# Patient Record
Sex: Male | Born: 1955
Health system: Southern US, Community
[De-identification: ages and names within clinical notes are randomized; demographics above are authoritative.]

## PROBLEM LIST (undated history)

## (undated) DIAGNOSIS — I82409 Acute embolism and thrombosis of unspecified deep veins of unspecified lower extremity: Secondary | ICD-10-CM

## (undated) DIAGNOSIS — M549 Dorsalgia, unspecified: Secondary | ICD-10-CM

## (undated) DIAGNOSIS — M25551 Pain in right hip: Secondary | ICD-10-CM

## (undated) DIAGNOSIS — I10 Essential (primary) hypertension: Secondary | ICD-10-CM

## (undated) DIAGNOSIS — M2450 Contracture, unspecified joint: Secondary | ICD-10-CM

## (undated) DIAGNOSIS — M25529 Pain in unspecified elbow: Secondary | ICD-10-CM

## (undated) DIAGNOSIS — G8929 Other chronic pain: Secondary | ICD-10-CM

## (undated) DIAGNOSIS — M720 Palmar fascial fibromatosis [Dupuytren]: Secondary | ICD-10-CM

## (undated) DIAGNOSIS — E785 Hyperlipidemia, unspecified: Secondary | ICD-10-CM

## (undated) HISTORY — DX: Acute embolism and thrombosis of unspecified deep veins of unspecified lower extremity: I82.409

## (undated) HISTORY — DX: Dorsalgia, unspecified: M54.9

## (undated) HISTORY — DX: Hyperlipidemia, unspecified: E78.5

## (undated) HISTORY — DX: Pain in right hip: M25.551

## (undated) HISTORY — PX: NO PAST SURGERIES: SHX2092

## (undated) HISTORY — DX: Other chronic pain: G89.29

## (undated) HISTORY — DX: Palmar fascial fibromatosis (dupuytren): M72.0

## (undated) HISTORY — DX: Contracture, unspecified joint: M24.50

## (undated) HISTORY — DX: Pain in unspecified elbow: M25.529

---

## 1999-06-26 ENCOUNTER — Encounter: Admission: RE | Admit: 1999-06-26 | Discharge: 1999-06-26 | Payer: Self-pay | Admitting: Family Medicine

## 1999-06-26 ENCOUNTER — Encounter: Payer: Self-pay | Admitting: Family Medicine

## 2001-02-25 ENCOUNTER — Encounter: Admission: RE | Admit: 2001-02-25 | Discharge: 2001-05-26 | Payer: Self-pay | Admitting: Orthopedic Surgery

## 2003-03-29 ENCOUNTER — Encounter: Admission: RE | Admit: 2003-03-29 | Discharge: 2003-03-29 | Payer: Self-pay | Admitting: Family Medicine

## 2005-11-12 LAB — HM COLONOSCOPY

## 2007-01-16 DIAGNOSIS — M2450 Contracture, unspecified joint: Secondary | ICD-10-CM

## 2007-01-16 HISTORY — DX: Contracture, unspecified joint: M24.50

## 2016-08-14 ENCOUNTER — Ambulatory Visit (HOSPITAL_COMMUNITY)
Admission: EM | Admit: 2016-08-14 | Discharge: 2016-08-14 | Disposition: A | Discharge status: Home / Self Care | Payer: 59 | Attending: Emergency Medicine | Admitting: Emergency Medicine

## 2016-08-14 ENCOUNTER — Encounter (HOSPITAL_COMMUNITY): Payer: Self-pay | Admitting: Emergency Medicine

## 2016-08-14 DIAGNOSIS — M79662 Pain in left lower leg: Secondary | ICD-10-CM

## 2016-08-14 DIAGNOSIS — M7989 Other specified soft tissue disorders: Secondary | ICD-10-CM

## 2016-08-14 HISTORY — DX: Essential (primary) hypertension: I10

## 2016-08-14 NOTE — Discharge Instructions (Signed)
Pain and swelling could be due to venous/arterial insufficiency, past knee injury. Take ibuprofen as directed for the next 10 days. Ice compress, elevation and compression to help with swelling. Monitor for worsening of symptoms such as increased pain/swelling, increased redness/warmth to go th the emergency department for further evaluation. If experiencing chest pain, shortness of breath, weakness, dizziness or other concerning symptoms, to go to the emergency department immediately.

## 2016-08-14 NOTE — ED Triage Notes (Signed)
PT reports swelling and pain over left calf that has worsened over past few days. PT is hypertensive. PT has not seen a doctor in 8-10 years. No history of blood clot. PT reports "two knots" on left foot a few months ago.

## 2016-08-14 NOTE — ED Provider Notes (Signed)
CSN: [id number]     Arrival date & time 08/14/16  1321 History   None    Chief Complaint  Patient presents with  . Leg Swelling   (Consider location/radiation/quality/duration/timing/severity/associated sxs/prior Treatment) 61 year old male with history of hypertension comes in for on and off swelling and pain of the left calf. Patient states he has not seen a doctor in 8-10 years. Symptoms worse after standing for the whole day, and is relieved by elevation. Denies numbness, tingling, injury. Denies surrounding erythema, increased warmth. Has not taken anything for it. Patient is every day smoker. Denies chest pain, shortness of breath, wheezing, trouble breathing. Denies weakness, dizziness, syncope, headache.      Past Medical History:  Diagnosis Date  . Hypertension    History reviewed. No pertinent surgical history. No family history on file. Social History  Substance Use Topics  . Smoking status: Current Every Day Smoker    Packs/day: 1.50    Types: Cigarettes  . Smokeless tobacco: Never Used  . Alcohol use Yes    Review of Systems  Reason unable to perform ROS: See HPI as above.    Allergies  Patient has no known allergies.  Home Medications   Prior to Admission medications   Not on File   Meds Ordered and Administered this Visit  Medications - No data to display  BP (!) 190/128   Pulse (!) 106   Temp 99.5 F (37.5 C) (Oral)   Resp 16   Ht 6' (1.829 m)   Wt 215 lb (97.5 kg)   SpO2 96%   BMI 29.16 kg/m  No data found.   Physical Exam  Constitutional: He is oriented to person, place, and time. He appears well-developed and well-nourished. No distress.  HENT:  Head: Normocephalic and atraumatic.  Cardiovascular: Normal rate, regular rhythm and normal heart sounds.  Exam reveals no gallop and no friction rub.   No murmur heard. Pulmonary/Chest: Effort normal and breath sounds normal. He has no wheezes. He has no rales.  Musculoskeletal:  Mild  swelling of the left medial calf. No surrounding erythema, increased warmth. No palpable cords. No pain on palpation of the knees bilaterally. Mild tenderness on palpation of the left medial calf. Strength normal and equal bilaterally. Sensation intact and equal bilaterally. Pedal pulses 2+ and equal. No pitting edema.  Neurological: He is alert and oriented to person, place, and time.  Skin: Skin is warm and dry.  Psychiatric: He has a normal mood and affect. His behavior is normal. Judgment normal.    Urgent Care Course     Procedures (including critical care time)  Labs Review Labs Reviewed - No data to display  Imaging Review No results found.    MDM   1. Pain and swelling of left lower leg    1. Discussed with patient possible causes of swelling and pain of the left calf, including DVT, past injury, venous and/or arterial insufficiency. I am reassured that patient's welling waxes and wanes with rest, but discussed with patient that he should have an ultrasound done to evaluate for DVT, venous/arterial insufficieny in the near future. Given patient has not seen a doctor in 8-10 years with hypertension, hesitant to prescribe for stronger NSAIDs to help with pain. Patient to continue with ibuprofen as directed for pain. Ice compress, elevation and compression to help with swelling. Patient to follow up with PCP as soon as possible for further evaluation.  2. Discussed vitals with patient today, patient denies chest pain, shortness  of breath, headache, weakness, dizziness, syncope. Patient to follow up with PCP as soon as possible for hypertension management. Patient instructed to go to the ED immediately if experiencing above symptoms. Patient encouraged to reduce smoking, alcohol/caffiene use, salt intake to help with hypertension management. Resources for PCP given to patient.    Belinda FisherYu, Amy V, PA-C 08/14/16 1415

## 2016-08-16 ENCOUNTER — Encounter: Payer: Self-pay | Admitting: Internal Medicine

## 2016-08-16 ENCOUNTER — Ambulatory Visit (INDEPENDENT_AMBULATORY_CARE_PROVIDER_SITE_OTHER): Payer: 59 | Admitting: Internal Medicine

## 2016-08-16 VITALS — BP 178/102 | HR 103 | Temp 98.6°F | Wt 213.8 lb

## 2016-08-16 DIAGNOSIS — M79605 Pain in left leg: Secondary | ICD-10-CM | POA: Diagnosis not present

## 2016-08-16 DIAGNOSIS — E7800 Pure hypercholesterolemia, unspecified: Secondary | ICD-10-CM

## 2016-08-16 DIAGNOSIS — I10 Essential (primary) hypertension: Secondary | ICD-10-CM | POA: Diagnosis not present

## 2016-08-16 DIAGNOSIS — M7989 Other specified soft tissue disorders: Secondary | ICD-10-CM

## 2016-08-16 LAB — COMPREHENSIVE METABOLIC PANEL WITH GFR
ALT: 25 U/L (ref 0–53)
AST: 22 U/L (ref 0–37)
Albumin: 4 g/dL (ref 3.5–5.2)
Alkaline Phosphatase: 75 U/L (ref 39–117)
BUN: 12 mg/dL (ref 6–23)
CO2: 31 meq/L (ref 19–32)
Calcium: 9.6 mg/dL (ref 8.4–10.5)
Chloride: 102 meq/L (ref 96–112)
Creatinine, Ser: 0.98 mg/dL (ref 0.40–1.50)
GFR: 82.5 mL/min
Glucose, Bld: 100 mg/dL — ABNORMAL HIGH (ref 70–99)
Potassium: 4.9 meq/L (ref 3.5–5.1)
Sodium: 137 meq/L (ref 135–145)
Total Bilirubin: 0.6 mg/dL (ref 0.2–1.2)
Total Protein: 7.5 g/dL (ref 6.0–8.3)

## 2016-08-16 LAB — HEMOGLOBIN A1C: Hgb A1c MFr Bld: 5.6 % (ref 4.6–6.5)

## 2016-08-16 LAB — CBC
HCT: 50.9 % (ref 39.0–52.0)
Hemoglobin: 16.8 g/dL (ref 13.0–17.0)
MCHC: 32.9 g/dL (ref 30.0–36.0)
MCV: 100.6 fl — ABNORMAL HIGH (ref 78.0–100.0)
Platelets: 236 10*3/uL (ref 150.0–400.0)
RBC: 5.06 Mil/uL (ref 4.22–5.81)
RDW: 12.9 % (ref 11.5–15.5)
WBC: 8.8 10*3/uL (ref 4.0–10.5)

## 2016-08-16 LAB — LIPID PANEL
Cholesterol: 180 mg/dL (ref 0–200)
HDL: 41.6 mg/dL (ref >39.00)
LDL Cholesterol: 119 mg/dL — ABNORMAL HIGH (ref 0–99)
NonHDL: 138.84
Total CHOL/HDL Ratio: 4
Triglycerides: 98 mg/dL (ref 0.0–149.0)
VLDL: 19.6 mg/dL (ref 0.0–40.0)

## 2016-08-16 MED ORDER — LISINOPRIL-HYDROCHLOROTHIAZIDE 10-12.5 MG PO TABS
1.0000 | ORAL_TABLET | Freq: Every day | ORAL | 0 refills | Status: DC
Start: 2016-08-16 — End: 2016-08-31

## 2016-08-16 NOTE — Assessment & Plan Note (Signed)
CMET, Lipid profile and A1C today May need to restart Simvastatin Advised him to consume a low fat diet

## 2016-08-16 NOTE — Progress Notes (Signed)
HPI  Pt presents to the clinic today to establish care and for management of chronic conditions. He has not had a PCP in many years.  HTN: He was in the ER with c/o right leg pain 2 days ago. His BP at that time was 190/128. He was not started on antihypertensive medication at that time but was advised to follow up with his PCP to discuss treatment options. His BP today is 178/102. He denies headaches, visual changes, chest tightness or shortness of breath. He has been on Lisinopril in the past.  HLD: He does not know the last time his cholesterol was checked. He is not taking any cholesterol lowering medications at this time. He was on Simvastatin in the past. He does not consume a low fat diet  He also c/o left calf pain. This started 1 week ago. He has noticed swelling and discomfort but denies redness or warmth. He was seen in the ER for the same. No intervention was done. He has taken Ibuprofen and elevation with some relief.  Flu: never Tetanus: never PSA Screening: never Colon Screening: ? 2007 Vision Screening: as needed Dentist: as needed  Past Medical History:  Diagnosis Date  . Hyperlipidemia   . Hypertension     Current Outpatient Prescriptions  Medication Sig Dispense Refill  . ASPIRIN 81 PO Take 1 tablet by mouth daily.    Marland Kitchen. ibuprofen (ADVIL,MOTRIN) 200 MG tablet Take 200 mg by mouth every 6 (six) hours as needed.     No current facility-administered medications for this visit.     No Known Allergies  Family History  Problem Relation Age of Onset  . Heart disease Mother     Social History   Social History  . Marital status: Divorced    Spouse name: N/A  . Number of children: N/A  . Years of education: N/A   Occupational History  . Not on file.   Social History Main Topics  . Smoking status: Current Every Day Smoker    Packs/day: 1.50    Types: Cigarettes  . Smokeless tobacco: Never Used  . Alcohol use Yes     Comment: daily  . Drug use: No  .  Sexual activity: Not on file   Other Topics Concern  . Not on file   Social History Narrative  . No narrative on file    ROS:  Constitutional: Denies fever, malaise, fatigue, headache or abrupt weight changes.  HEENT: Denies eye pain, eye redness, ear pain, ringing in the ears, wax buildup, runny nose, nasal congestion, bloody nose, or sore throat. Respiratory: Denies difficulty breathing, shortness of breath, cough or sputum production.   Cardiovascular: Denies chest pain, chest tightness, palpitations or swelling in the hands or feet.  Gastrointestinal: Denies abdominal pain, bloating, constipation, diarrhea or blood in the stool.  GU: Denies frequency, urgency, pain with urination, blood in urine, odor or discharge. Musculoskeletal: Pt reports left leg swelling and pain. Denies decrease in range of motion, difficulty with gait.  Skin: Denies redness, rashes, lesions or ulcercations.  Neurological: Denies dizziness, difficulty with memory, difficulty with speech or problems with balance and coordination.  Psych: Denies anxiety, depression, SI/HI.  No other specific complaints in a complete review of systems (except as listed in HPI above).  PE:  BP (!) 178/102   Pulse (!) 103   Temp 98.6 F (37 C) (Oral)   Wt 213 lb 12 oz (97 kg)   SpO2 97%   BMI 28.99 kg/m  Wt Readings from Last 3 Encounters:  08/16/16 213 lb 12 oz (97 kg)  08/14/16 215 lb (97.5 kg)    General: Appears his stated age, well developed, well nourished in NAD. Skin: Dry and intact. Cardiovascular: Tachycardic with normal rhythm. S1,S2 noted.  No murmur, rubs or gallops noted. No JVD or BLE edema. No carotid bruits noted. Pulmonary/Chest: Normal effort and positive vesicular breath sounds. No respiratory distress. No wheezes, rales or ronchi noted.  Musculoskeletal: Left calf bigger than right calf. No redness or warmth noted. No difficulty with gait. Neurological: Alert and oriented.  Psychiatric: Mood  and affect normal. Behavior is normal. Judgment and thought content normal.   Assessment and Plan:  Left Leg Pain and Swelling:  Will obtain ultrasound of LLE  RTC in 2 weeks for BP check BAITY, REGINA, NP

## 2016-08-16 NOTE — Patient Instructions (Signed)

## 2016-08-16 NOTE — Assessment & Plan Note (Signed)
Will start Lisinopril HCT CMET today

## 2016-08-17 ENCOUNTER — Telehealth: Payer: Self-pay | Admitting: Family Medicine

## 2016-08-17 ENCOUNTER — Ambulatory Visit (HOSPITAL_COMMUNITY)
Admission: RE | Admit: 2016-08-17 | Discharge: 2016-08-17 | Disposition: A | Discharge status: Home / Self Care | Payer: 59 | Source: Ambulatory Visit | Attending: Cardiovascular Disease | Admitting: Cardiovascular Disease

## 2016-08-17 DIAGNOSIS — I82432 Acute embolism and thrombosis of left popliteal vein: Secondary | ICD-10-CM | POA: Insufficient documentation

## 2016-08-17 DIAGNOSIS — Z720 Tobacco use: Secondary | ICD-10-CM | POA: Diagnosis not present

## 2016-08-17 DIAGNOSIS — M7989 Other specified soft tissue disorders: Secondary | ICD-10-CM | POA: Diagnosis not present

## 2016-08-17 DIAGNOSIS — M79605 Pain in left leg: Secondary | ICD-10-CM | POA: Diagnosis not present

## 2016-08-17 DIAGNOSIS — I82402 Acute embolism and thrombosis of unspecified deep veins of left lower extremity: Secondary | ICD-10-CM | POA: Diagnosis not present

## 2016-08-17 DIAGNOSIS — I82890 Acute embolism and thrombosis of other specified veins: Secondary | ICD-10-CM | POA: Insufficient documentation

## 2016-08-17 DIAGNOSIS — I82442 Acute embolism and thrombosis of left tibial vein: Secondary | ICD-10-CM | POA: Diagnosis not present

## 2016-08-17 DIAGNOSIS — I82412 Acute embolism and thrombosis of left femoral vein: Secondary | ICD-10-CM | POA: Insufficient documentation

## 2016-08-17 DIAGNOSIS — I10 Essential (primary) hypertension: Secondary | ICD-10-CM | POA: Insufficient documentation

## 2016-08-17 DIAGNOSIS — I82409 Acute embolism and thrombosis of unspecified deep veins of unspecified lower extremity: Secondary | ICD-10-CM

## 2016-08-17 DIAGNOSIS — E785 Hyperlipidemia, unspecified: Secondary | ICD-10-CM | POA: Diagnosis not present

## 2016-08-17 HISTORY — DX: Acute embolism and thrombosis of unspecified deep veins of unspecified lower extremity: I82.409

## 2016-08-17 MED ORDER — RIVAROXABAN 15 MG PO TABS: 15.0000 mg | ORAL_TABLET | Freq: Two times a day (BID) | ORAL | 0 refills | Status: DC

## 2016-08-17 NOTE — Telephone Encounter (Signed)
Walter DavenportSandra with [facility] called report; pt positive for DVT thru out lt leg. Pamala Hurry Baity NP out of office and Dr Reece AgarG was notified.Pt notified as instructed;pt will cb if xarelto is too expensive; [facility] minimizing ibuprofen use while on Xarelto. Pt will start Xarelto today and if CP or SOB pt will go to ED.pt has 30' appt scheduled on 08/20/16 at 11:15 with Pamala Hurry Baity NP. Lorain ChildesFYI to Pamala Hurry Baity NP and Dr Reece AgarG.

## 2016-08-17 NOTE — Telephone Encounter (Signed)
Received call for positive DVT on US this morning. rec start xarelto sent to pharmacy.  15mg  bid for 3 wks then 20mg  daily for next several months, f/u with PCP next week to further discuss newly found DVT, duration, etc.  If dyspnea, chest pain, worsening sxs, to ER over weekend.  Try to minimize ibuprofen use while on xarelto.

## 2016-08-18 NOTE — Telephone Encounter (Signed)
Noted, thank you for addressing this in my absence

## 2016-08-20 ENCOUNTER — Ambulatory Visit (INDEPENDENT_AMBULATORY_CARE_PROVIDER_SITE_OTHER): Payer: 59 | Admitting: Internal Medicine

## 2016-08-20 ENCOUNTER — Encounter: Payer: Self-pay | Admitting: Internal Medicine

## 2016-08-20 VITALS — BP 138/80 | HR 101 | Temp 98.9°F | Wt 211.5 lb

## 2016-08-20 DIAGNOSIS — I82402 Acute embolism and thrombosis of unspecified deep veins of left lower extremity: Secondary | ICD-10-CM | POA: Insufficient documentation

## 2016-08-20 NOTE — Progress Notes (Signed)
Subjective:    Patient ID: Walter Maddox, male    DOB: 1955-04-02, 61 y.o.   MRN: [id number]  HPI  Pt presents to the clinic to follow up of left leg DVT. This was confirmed on LE Ultrasound 8/3. He was started on Xarelto. He reports the swelling has decreased, but he continue to have discomfort, especially with walking. He is not sedentary, he has not experienced a long car travel or air travel. He has no history of blood clots in his family. He does smoke.  Review of Systems      Past Medical History:  Diagnosis Date  . Hyperlipidemia   . Hypertension     Current Outpatient Prescriptions  Medication Sig Dispense Refill  . ASPIRIN 81 PO Take 1 tablet by mouth daily.    Marland Kitchen ibuprofen (ADVIL,MOTRIN) 200 MG tablet Take 200 mg by mouth every 6 (six) hours as needed.    Marland Kitchen lisinopril-hydrochlorothiazide (PRINZIDE,ZESTORETIC) 10-12.5 MG tablet Take 1 tablet by mouth daily. 30 tablet 0  . Rivaroxaban (XARELTO) 15 MG TABS tablet Take 1 tablet (15 mg total) by mouth 2 (two) times daily with a meal. For 3 weeks then change to 20mg  dose once daily. 42 tablet 0   No current facility-administered medications for this visit.     No Known Allergies  Family History  Problem Relation Age of Onset  . Heart disease Mother     Social History   Social History  . Marital status: Divorced    Spouse name: N/A  . Number of children: N/A  . Years of education: N/A   Occupational History  . Not on file.   Social History Main Topics  . Smoking status: Current Every Day Smoker    Packs/day: 1.50    Years: 34.00    Types: Cigarettes  . Smokeless tobacco: Never Used  . Alcohol use 3.6 oz/week    6 Cans of beer per week     Comment: daily  . Drug use: No  . Sexual activity: Yes   Other Topics Concern  . Not on file   Social History Narrative  . No narrative on file     Constitutional: Denies fever, malaise, fatigue, headache or abrupt weight changes.  Musculoskeletal: Pt reports  left lower extremity pain. Denies decrease in range of motion, difficulty with gait, muscle pain or joint swelling.  Skin: Denies redness, rashes, lesions or ulcercations.    No other specific complaints in a complete review of systems (except as listed in HPI above).  Objective:   Physical Exam  BP 138/80   Pulse (!) 101   Temp 98.9 F (37.2 C) (Oral)   Wt 211 lb 8 oz (95.9 kg)   SpO2 98%   BMI 28.68 kg/m  Wt Readings from Last 3 Encounters:  08/20/16 211 lb 8 oz (95.9 kg)  08/16/16 213 lb 12 oz (97 kg)  08/14/16 215 lb (97.5 kg)    General: Appears his stated age, well developed, well nourished in NAD. Skin: Warm, dry and intact. No redness or warmth noted.  Musculoskeletal: No swelling. Calf tender to palpation. No difficulty with gait.  BMET    Component Value Date/Time   NA 137 08/16/2016 1448   K 4.9 08/16/2016 1448   CL 102 08/16/2016 1448   CO2 31 08/16/2016 1448   GLUCOSE 100 (H) 08/16/2016 1448   BUN 12 08/16/2016 1448   CREATININE 0.98 08/16/2016 1448   CALCIUM 9.6 08/16/2016 1448    Lipid  Panel     Component Value Date/Time   CHOL 180 08/16/2016 1448   TRIG 98.0 08/16/2016 1448   HDL 41.60 08/16/2016 1448   CHOLHDL 4 08/16/2016 1448   VLDL 19.6 08/16/2016 1448   LDLCALC 119 (H) 08/16/2016 1448    CBC    Component Value Date/Time   WBC 8.8 08/16/2016 1448   RBC 5.06 08/16/2016 1448   HGB 16.8 08/16/2016 1448   HCT 50.9 08/16/2016 1448   PLT 236.0 08/16/2016 1448   MCV 100.6 (H) 08/16/2016 1448   MCHC 32.9 08/16/2016 1448   RDW 12.9 08/16/2016 1448    Hgb A1C Lab Results  Component Value Date   HGBA1C 5.6 08/16/2016           Assessment & Plan:

## 2016-08-20 NOTE — Patient Instructions (Signed)
Deep Vein Thrombosis A deep vein thrombosis (DVT) is a blood clot (thrombus) that usually occurs in a deep, larger vein of the lower leg or the pelvis, or in an upper extremity such as the arm. These are dangerous and can lead to serious and even life-threatening complications if the clot travels to the lungs. A DVT can damage the valves in your leg veins so that instead of flowing upward, the blood pools in the lower leg. This is called post-thrombotic syndrome, and it can result in pain, swelling, discoloration, and sores on the leg. What are the causes? A DVT is caused by the formation of a blood clot in your leg, pelvis, or arm. Usually, several things contribute to the formation of blood clots. A clot may develop when:  Your blood flow slows down.  Your vein becomes damaged in some way.  You have a condition that makes your blood clot more easily.  What increases the risk? A DVT is more likely to develop in:  People who are older, especially over 60 years of age.  People who are overweight (obese).  People who sit or lie still for a long time, such as during long-distance travel (over 4 hours), bed rest, hospitalization, or during recovery from certain medical conditions like a stroke.  People who do not engage in much physical activity (sedentary lifestyle).  People who have chronic breathing disorders.  People who have a personal or family history of blood clots or blood clotting disease.  People who have peripheral vascular disease (PVD), diabetes, or some types of cancer.  People who have heart disease, especially if the person had a recent heart attack or has congestive heart failure.  People who have neurological diseases that affect the legs (leg paresis).  People who have had a traumatic injury, such as breaking a hip or leg.  People who have recently had major or lengthy surgery, especially on the hip, knee, or abdomen.  People who have had a central line placed  inside a large vein.  People who take medicines that contain the hormone estrogen. These include birth control pills and hormone replacement therapy.  Pregnancy or during childbirth or the postpartum period.  Long plane flights (over 8 hours).  What are the signs or symptoms?  Symptoms of a DVT can include:  Swelling of your leg or arm, especially if one side is much worse.  Warmth and redness of your leg or arm, especially if one side is much worse.  Pain in your arm or leg. If the clot is in your leg, symptoms may be more noticeable or worse when you stand or walk.  A feeling of pins and needles, if the clot is in the arm.  The symptoms of a DVT that has traveled to the lungs (pulmonary embolism, PE) usually start suddenly and include:  Shortness of breath while active or at rest.  Coughing or coughing up blood or blood-tinged mucus.  Chest pain that is often worse with deep breaths.  Rapid or irregular heartbeat.  Feeling light-headed or dizzy.  Fainting.  Feeling anxious.  Sweating.  There may also be pain and swelling in a leg if that is where the blood clot started. These symptoms may represent a serious problem that is an emergency. Do not wait to see if the symptoms will go away. Get medical help right away. Call your local emergency services (911 in the [place] Do not drive yourself to the hospital. How is this diagnosed? Your health   care provider will take a medical history and perform a physical exam. You may also have other tests, including:  Blood tests to assess the clotting properties of your blood.  Imaging tests, such as CT, ultrasound, MRI, X-ray, and other tests to see if you have clots anywhere in your body.  How is this treated? After a DVT is identified, it can be treated. The type of treatment that you receive depends on many factors, such as the cause of your DVT, your risk for bleeding or developing more clots, and other medical conditions that  you have. Sometimes, a combination of treatments is necessary. Treatment options may be combined and include:  Monitoring the blood clot with ultrasound.  Taking medicines by mouth, such as newer blood thinners (anticoagulants), thrombolytics, or warfarin.  Taking anticoagulant medicine by injection or through an IV tube.  Wearing compression stockings or using different types ofdevices.  Surgery (rare) to remove the blood clot or to place a filter in your abdomen to stop the blood clot from traveling to your lungs.  Treatments for a DVT are often divided into immediate treatment and long-term treatment (up to 3 months after DVT). You can work with your health care provider to choose the treatment program that is best for you. Follow these instructions at home: If you are taking a newer oral anticoagulant:  Take the medicine every single day at the same time each day.  Understand what foods and drugs interact with this medicine.  Understand that there are no regular blood tests required when using this medicine.  Understand the side effects of this medicine, including excessive bruising or bleeding. Ask your health care provider or pharmacist about other possible side effects. If you are taking warfarin:  Understand how to take warfarin and know which foods can affect how warfarin works in your body.  Understand that it is dangerous to take too much or too little warfarin. Too much warfarin increases the risk of bleeding. Too little warfarin continues to allow the risk for blood clots.  Follow your PT and INR blood testing schedule. The PT and INR results allow your health care provider to adjust your dose of warfarin. It is very important that you have your PT and INR tested as often as told by your health care provider.  Avoid major changes in your diet, or tell your health care provider before you change your diet. Arrange a visit with a registered dietitian to answer your  questions. Many foods, especially foods that are high in vitamin K, can interfere with warfarin and affect the PT and INR results. Eat a consistent amount of foods that are high in vitamin K, such as: ? Spinach, kale, broccoli, cabbage, collard greens, turnip greens, Brussels sprouts, peas, cauliflower, seaweed, and parsley. ? Beef liver and pork liver. ? Green tea. ? Soybean oil.  Tell your health care provider about any and all medicines, vitamins, and supplements that you take, including aspirin and other over-the-counter anti-inflammatory medicines. Be especially cautious with aspirin and anti-inflammatory medicines. Do not take those before you ask your health care provider if it is safe to do so. This is important because many medicines can interfere with warfarin and affect the PT and INR results.  Do not start or stop taking any over-the-counter or prescription medicine unless your health care provider or pharmacist tells you to do so. If you take warfarin, you will also need to do these things:  Hold pressure over cuts for longer than   usual.  Tell your dentist and other health care providers that you are taking warfarin before you have any procedures in which bleeding may occur.  Avoid alcohol or drink very small amounts. Tell your health care provider if you change your alcohol intake.  Do not use tobacco products, including cigarettes, chewing tobacco, and e-cigarettes. If you need help quitting, ask your health care provider.  Avoid contact sports.  General instructions  Take over-the-counter and prescription medicines only as told by your health care provider. Anticoagulant medicines can have side effects, including easy bruising and difficulty stopping bleeding. If you are prescribed an anticoagulant, you will also need to do these things: ? Hold pressure over cuts for longer than usual. ? Tell your dentist and other health care providers that you are taking anticoagulants  before you have any procedures in which bleeding may occur. ? Avoid contact sports.  Wear a medical alert bracelet or carry a medical alert card that says you have had a PE.  Ask your health care provider how soon you can go back to your normal activities. Stay active to prevent new blood clots from forming.  Make sure to exercise while traveling or when you have been sitting or standing for a long period of time. It is very important to exercise. Exercise your legs by walking or by tightening and relaxing your leg muscles often. Take frequent walks.  Wear compression stockings as told by your health care provider to help prevent more blood clots from forming.  Do not use tobacco products, including cigarettes, chewing tobacco, and e-cigarettes. If you need help quitting, ask your health care provider.  Keep all follow-up appointments with your health care provider. This is important. How is this prevented? Take these actions to decrease your risk of developing another DVT:  Exercise regularly. For at least 30 minutes every day, engage in: ? Activity that involves moving your arms and legs. ? Activity that encourages good blood flow through your body by increasing your heart rate.  Exercise your arms and legs every hour during long-distance travel (over 4 hours). Drink plenty of water and avoid drinking alcohol while traveling.  Avoid sitting or lying in bed for long periods of time without moving your legs.  Maintain a weight that is appropriate for your height. Ask your health care provider what weight is healthy for you.  If you are a woman who is over 35 years of age, avoid unnecessary use of medicines that contain estrogen. These include birth control pills.  Do not smoke, especially if you take estrogen medicines. If you need help quitting, ask your health care provider.  If you are hospitalized, prevention measures may include:  Early walking after surgery, as soon as your  health care provider says that it is safe.  Receiving anticoagulants to prevent blood clots.If you cannot take anticoagulants, other options may be available, such as wearing compression stockings or using different types of devices.  Get help right away if:  You have new or increased pain, swelling, or redness in an arm or leg.  You have numbness or tingling in an arm or leg.  You have shortness of breath while active or at rest.  You have chest pain.  You have a rapid or irregular heartbeat.  You feel light-headed or dizzy.  You cough up blood.  You notice blood in your vomit, bowel movement, or urine. These symptoms may represent a serious problem that is an emergency. Do not wait to see   if the symptoms will go away. Get medical help right away. Call your local emergency services (911 in the [place] Do not drive yourself to the hospital. This information is not intended to replace advice given to you by your health care provider. Make sure you discuss any questions you have with your health care provider. Document Released: 01/01/2005 Document Revised: 06/09/2015 Document Reviewed: 04/28/2014 Elsevier Interactive Patient Education  2017 [employer]  

## 2016-08-20 NOTE — Assessment & Plan Note (Signed)
Continue Xarelto Referral to hematology placed

## 2016-08-27 ENCOUNTER — Telehealth: Payer: Self-pay | Admitting: Internal Medicine

## 2016-08-27 NOTE — Telephone Encounter (Signed)
Paperwork faxed °Pt aware °Copy for file  °Copy for scan °Copy for pt °

## 2016-08-27 NOTE — Telephone Encounter (Signed)
Pt dropped of short term disability claim form  In [facility] IN BOX

## 2016-08-27 NOTE — Telephone Encounter (Signed)
Done, given to Robin 

## 2016-08-29 ENCOUNTER — Encounter: Payer: Self-pay | Admitting: Internal Medicine

## 2016-08-31 ENCOUNTER — Ambulatory Visit (INDEPENDENT_AMBULATORY_CARE_PROVIDER_SITE_OTHER): Payer: 59 | Admitting: Internal Medicine

## 2016-08-31 ENCOUNTER — Encounter: Payer: Self-pay | Admitting: Internal Medicine

## 2016-08-31 VITALS — BP 120/70 | HR 97 | Temp 98.8°F | Wt 208.5 lb

## 2016-08-31 DIAGNOSIS — I10 Essential (primary) hypertension: Secondary | ICD-10-CM | POA: Diagnosis not present

## 2016-08-31 DIAGNOSIS — I82402 Acute embolism and thrombosis of unspecified deep veins of left lower extremity: Secondary | ICD-10-CM

## 2016-08-31 MED ORDER — RIVAROXABAN 20 MG PO TABS: 20.0000 mg | ORAL_TABLET | Freq: Every day | ORAL | 5 refills | Status: DC

## 2016-08-31 MED ORDER — LISINOPRIL-HYDROCHLOROTHIAZIDE 10-12.5 MG PO TABS: 1.0000 | ORAL_TABLET | Freq: Every day | ORAL | 5 refills | Status: DC

## 2016-08-31 NOTE — Assessment & Plan Note (Addendum)
RX for Xarelto 20 mg daily sent to pharmacy Monitor for s/s of bleeding No NSAID's while on Xarelto

## 2016-08-31 NOTE — Assessment & Plan Note (Addendum)
Now at goal ECG today with left atrial enlargement BMET today Lisinopril HCT refilled today  RTC in 6 months for your annual exam

## 2016-08-31 NOTE — Progress Notes (Signed)
Subjective:    Patient ID: Walter Maddox, male    DOB: 23-May-1955, 61 y.o.   MRN: [id number]  HPI  Pt presents to the clinic today for follow up of HTN. At his last visit, he was started on Lisinopril HCT. He has been taking the medication as prescribed. He denies adverse side effects. His BP today is 120/70. There is no ECG on file.  Of note, he needs a RX for the Xarelto 20 mg daily. He is about to finish up his 3 weeks of 15 mg BID. He has not noticed any s/s of bleeding.   Review of Systems      Past Medical History:  Diagnosis Date  . Hyperlipidemia   . Hypertension     Current Outpatient Prescriptions  Medication Sig Dispense Refill  . ASPIRIN 81 PO Take 1 tablet by mouth daily.    Marland Kitchen ibuprofen (ADVIL,MOTRIN) 200 MG tablet Take 200 mg by mouth every 6 (six) hours as needed.    Marland Kitchen lisinopril-hydrochlorothiazide (PRINZIDE,ZESTORETIC) 10-12.5 MG tablet Take 1 tablet by mouth daily. 30 tablet 0  . Rivaroxaban (XARELTO) 15 MG TABS tablet Take 1 tablet (15 mg total) by mouth 2 (two) times daily with a meal. For 3 weeks then change to 20mg  dose once daily. 42 tablet 0   No current facility-administered medications for this visit.     No Known Allergies  Family History  Problem Relation Age of Onset  . Heart disease Mother     Social History   Social History  . Marital status: Divorced    Spouse name: N/A  . Number of children: N/A  . Years of education: N/A   Occupational History  . Not on file.   Social History Main Topics  . Smoking status: Current Every Day Smoker    Packs/day: 1.50    Years: 34.00    Types: Cigarettes  . Smokeless tobacco: Never Used  . Alcohol use 3.6 oz/week    6 Cans of beer per week     Comment: daily  . Drug use: No  . Sexual activity: Yes   Other Topics Concern  . Not on file   Social History Narrative  . No narrative on file     Constitutional: Denies fever, malaise, fatigue, headache or abrupt weight changes.    Respiratory: Denies difficulty breathing, shortness of breath, cough or sputum production.   Cardiovascular: Denies chest pain, chest tightness, palpitations or swelling in the hands or feet.   No other specific complaints in a complete review of systems (except as listed in HPI above).  Objective:   Physical Exam   BP 120/70   Pulse 97   Temp 98.8 F (37.1 C) (Oral)   Wt 208 lb 8 oz (94.6 kg)   SpO2 97%   BMI 28.28 kg/m  Wt Readings from Last 3 Encounters:  08/31/16 208 lb 8 oz (94.6 kg)  08/20/16 211 lb 8 oz (95.9 kg)  08/16/16 213 lb 12 oz (97 kg)    General: Appears his stated age, well developed, well nourished in NAD. Cardiovascular: Normal rate and rhythm. Split S1 noted.  No murmur, rubs or gallops noted. No JVD or BLE edema.  Pulmonary/Chest: Normal effort and positive vesicular breath sounds. No respiratory distress. No wheezes, rales or ronchi noted.  Musculoskeletal: Normal range of motion. No signs of joint swelling. No difficulty with gait.  Neurological: Alert and oriented.    BMET    Component Value Date/Time  NA 137 08/16/2016 1448   K 4.9 08/16/2016 1448   CL 102 08/16/2016 1448   CO2 31 08/16/2016 1448   GLUCOSE 100 (H) 08/16/2016 1448   BUN 12 08/16/2016 1448   CREATININE 0.98 08/16/2016 1448   CALCIUM 9.6 08/16/2016 1448    Lipid Panel     Component Value Date/Time   CHOL 180 08/16/2016 1448   TRIG 98.0 08/16/2016 1448   HDL 41.60 08/16/2016 1448   CHOLHDL 4 08/16/2016 1448   VLDL 19.6 08/16/2016 1448   LDLCALC 119 (H) 08/16/2016 1448    CBC    Component Value Date/Time   WBC 8.8 08/16/2016 1448   RBC 5.06 08/16/2016 1448   HGB 16.8 08/16/2016 1448   HCT 50.9 08/16/2016 1448   PLT 236.0 08/16/2016 1448   MCV 100.6 (H) 08/16/2016 1448   MCHC 32.9 08/16/2016 1448   RDW 12.9 08/16/2016 1448    Hgb A1C Lab Results  Component Value Date   HGBA1C 5.6 08/16/2016           Assessment & Plan:

## 2016-08-31 NOTE — Patient Instructions (Signed)

## 2016-09-01 LAB — BASIC METABOLIC PANEL
BUN: 12 mg/dL (ref 7–25)
CALCIUM: 9.5 mg/dL (ref 8.6–10.3)
CO2: 22 mmol/L (ref 20–32)
CREATININE: 1 mg/dL (ref 0.70–1.25)
Chloride: 100 mmol/L (ref 98–110)
GLUCOSE: 103 mg/dL — AB (ref 65–99)
Potassium: 3.9 mmol/L (ref 3.5–5.3)
SODIUM: 138 mmol/L (ref 135–146)

## 2016-09-03 ENCOUNTER — Encounter: Payer: Self-pay | Admitting: Oncology

## 2016-09-03 ENCOUNTER — Inpatient Hospital Stay: Payer: 59 | Attending: Oncology | Admitting: Oncology

## 2016-09-03 ENCOUNTER — Inpatient Hospital Stay: Payer: 59

## 2016-09-03 VITALS — BP 130/89 | HR 100 | Temp 96.3°F | Resp 18 | Ht 72.84 in | Wt 209.6 lb

## 2016-09-03 DIAGNOSIS — Z716 Tobacco abuse counseling: Secondary | ICD-10-CM

## 2016-09-03 DIAGNOSIS — I82402 Acute embolism and thrombosis of unspecified deep veins of left lower extremity: Secondary | ICD-10-CM

## 2016-09-03 DIAGNOSIS — Z7982 Long term (current) use of aspirin: Secondary | ICD-10-CM | POA: Diagnosis not present

## 2016-09-03 DIAGNOSIS — Z7901 Long term (current) use of anticoagulants: Secondary | ICD-10-CM | POA: Insufficient documentation

## 2016-09-03 DIAGNOSIS — D7589 Other specified diseases of blood and blood-forming organs: Secondary | ICD-10-CM

## 2016-09-03 DIAGNOSIS — Z129 Encounter for screening for malignant neoplasm, site unspecified: Secondary | ICD-10-CM

## 2016-09-03 DIAGNOSIS — I10 Essential (primary) hypertension: Secondary | ICD-10-CM | POA: Diagnosis not present

## 2016-09-03 DIAGNOSIS — F17210 Nicotine dependence, cigarettes, uncomplicated: Secondary | ICD-10-CM | POA: Diagnosis not present

## 2016-09-03 DIAGNOSIS — E785 Hyperlipidemia, unspecified: Secondary | ICD-10-CM | POA: Diagnosis not present

## 2016-09-03 LAB — CBC WITH DIFFERENTIAL/PLATELET
BASOS ABS: 0.1 10*3/uL (ref 0–0.1)
Basophils Relative: 1 %
Eosinophils Absolute: 0.1 10*3/uL (ref 0–0.7)
Eosinophils Relative: 1 %
HEMATOCRIT: 48.2 % (ref 40.0–52.0)
HEMOGLOBIN: 17.2 g/dL (ref 13.0–18.0)
Lymphocytes Relative: 21 %
Lymphs Abs: 2.2 10*3/uL (ref 1.0–3.6)
MCH: 33.2 pg (ref 26.0–34.0)
MCHC: 35.6 g/dL (ref 32.0–36.0)
MCV: 93.2 fL (ref 80.0–100.0)
Monocytes Absolute: 1 10*3/uL (ref 0.2–1.0)
Monocytes Relative: 10 %
NEUTROS PCT: 67 %
Neutro Abs: 7.2 10*3/uL — ABNORMAL HIGH (ref 1.4–6.5)
Platelets: 322 10*3/uL (ref 150–440)
RBC: 5.17 MIL/uL (ref 4.40–5.90)
RDW: 12.4 % (ref 11.5–14.5)
WBC: 10.6 10*3/uL (ref 3.8–10.6)

## 2016-09-03 LAB — TECHNOLOGIST SMEAR REVIEW

## 2016-09-03 LAB — FOLATE: FOLATE: 27 ng/mL (ref >5.9)

## 2016-09-03 LAB — VITAMIN B12: VITAMIN B 12: 750 pg/mL (ref 180–914)

## 2016-09-03 NOTE — Progress Notes (Signed)
Hematology/Oncology Consult note [facility] [phone] [phone]  CONSULT NOTE Patient Care Team: Lorre Munroe, NP as PCP - General (Internal Medicine)  CHIEF COMPLAINTS/PURPOSE OF CONSULTATION:  I have leg clots.   HISTORY OF PRESENTING ILLNESS:  Walter Maddox 61 y.o.  male with past medical history listed as below was referred by his primary care provider Baity [facility] for evaluation and management of extremity DVT. Patient presented to emergency room on 08/16/2016 with complaints of lower extremity pain right side. Patient reports that his lower extremity pain started about a week ago prior to him presenting to the emergency room. Ultrasound duplex of bilateral lower extremity revealed acute DVT in the left distal common femoral, femoral popliteal, gastrocnemius, posterior tibial, and peroneal veins. No DVT on the right side. Patient was started on Xarelto for anticoagulation.   ROS:  Review of Systems  Constitutional: Negative.   HENT:  Negative.   Eyes: Negative.   Respiratory: Negative.   Cardiovascular: Negative.   Gastrointestinal: Negative.   Endocrine: Negative.   Genitourinary: Negative.    Musculoskeletal: Negative.   Skin: Negative.   Neurological: Negative.   Hematological: Negative.   Psychiatric/Behavioral: Negative.     MEDICAL HISTORY:  Past Medical History:  Diagnosis Date  . Hyperlipidemia   . Hypertension     SURGICAL HISTORY: No past surgical history on file.  SOCIAL HISTORY: Social History   Social History  . Marital status: Divorced    Spouse name: N/A  . Number of children: N/A  . Years of education: N/A   Occupational History  . Not on file.   Social History Main Topics  . Smoking status: Current Every Day Smoker    Packs/day: 1.50    Years: 34.00    Types: Cigarettes  . Smokeless tobacco: Never Used  . Alcohol use 3.6 oz/week    6 Cans of beer per week     Comment: daily  .  Drug use: No  . Sexual activity: Yes   Other Topics Concern  . Not on file   Social History Narrative  . No narrative on file    FAMILY HISTORY: Family History  Problem Relation Age of Onset  . Heart disease Mother     ALLERGIES:  has No Known Allergies.  MEDICATIONS:  Current Outpatient Prescriptions  Medication Sig Dispense Refill  . ASPIRIN 81 PO Take 1 tablet by mouth daily.    Marland Kitchen ibuprofen (ADVIL,MOTRIN) 200 MG tablet Take 200 mg by mouth every 6 (six) hours as needed.    Marland Kitchen lisinopril-hydrochlorothiazide (PRINZIDE,ZESTORETIC) 10-12.5 MG tablet Take 1 tablet by mouth daily. 30 tablet 5  . rivaroxaban (XARELTO) 20 MG TABS tablet Take 1 tablet (20 mg total) by mouth daily with supper. 30 tablet 5   No current facility-administered medications for this visit.       Marland Kitchen  PHYSICAL EXAMINATION: ECOG PERFORMANCE STATUS: 0 - Asymptomatic Vitals:   09/03/16 1433  BP: 130/89  Pulse: 100  Resp: 18  Temp: (!) 96.3 F (35.7 C)   Filed Weights   09/03/16 1433  Weight: 209 lb 9.6 oz (95.1 kg)    GENERAL: Well-nourished well-developed; Alert, no distress and comfortable.  EYES: no pallor or icterus OROPHARYNX: no thrush or ulceration; good dentition  NECK: supple, no masses felt LYMPH:  no palpable lymphadenopathy in the cervical, axillary or inguinal regions LUNGS: clear to auscultation and  No wheeze or crackles HEART/CVS: regular rate & rhythm and no murmurs; No lower  extremity edema ABDOMEN: abdomen soft, non-tender and normal bowel sounds Musculoskeletal:no cyanosis of digits and no clubbing  PSYCH: alert & oriented x 3  NEURO: no focal motor/sensory deficits SKIN:  no rashes or significant lesions  LABORATORY DATA:  I have reviewed the data as listed Lab Results  Component Value Date   WBC 8.8 08/16/2016   HGB 16.8 08/16/2016   HCT 50.9 08/16/2016   MCV 100.6 (H) 08/16/2016   PLT 236.0 08/16/2016    Recent Labs  08/16/16 1448 08/31/16 1513  NA 137 138   K 4.9 3.9  CL 102 100  CO2 31 22  GLUCOSE 100* 103*  BUN 12 12  CREATININE 0.98 1.00  CALCIUM 9.6 9.5  PROT 7.5  --   ALBUMIN 4.0  --   AST 22  --   ALT 25  --   ALKPHOS 75  --   BILITOT 0.6  --     RADIOGRAPHIC STUDIES: I have personally reviewed the radiological images as listed and agreed with the findings in the report. No results found.  08/17/2016 Ultrasound duplex of bilateral lower extremity revealed acute DVT in the left distal common femoral, femoral popliteal, gastrocnemius, posterior tibial, and peroneal veins.  ASSESSMENT & PLAN:  1. Acute deep vein thrombosis (DVT) of left lower extremity, unspecified vein (HCC)   2. Macrocytosis   3. Tobacco abuse counseling   4. Cancer screening     Check hypercoagulable workup including factor V Leiden mutation, prothrombin mutation, antiphospholipid antibody panel. I will hold checking protein C&S activity right now due to the acute setting. Continue Xarelto for anticoagulation. Duration of anticoagulation is long-term given nature of unprovoked proximal DVT.   Macrocytosis, we'll check smear review, folate and B12 levels. Smoke cessation was discussed in detail with patient.Given his extensive smoking history, ordered CT chest without for lung cancer screening. Age appropriate cancer screening was discussed with patient. Patient is also due for colonoscopy which he prefer to be discussed in the future instep today.  All questions were answered. The patient knows to call the clinic with any problems questions or concerns.  Return of visit:  Thank you for this kind referral and the opportunity to participate in the care of this patient. A copy of today's note is routed to referring provider    Rickard Patience, MD, PhD Hematology Oncology [facility] Pager- [phone] 09/03/2016

## 2016-09-03 NOTE — Progress Notes (Signed)
Here for new pt evaluation  Pt agreed to tell this writer if he wanted to take advantage of counseling services offered at [facility]. Anxious today  Support provided

## 2016-09-04 LAB — ANTINUCLEAR ANTIBODIES, IFA: ANTINUCLEAR ANTIBODIES, IFA: NEGATIVE

## 2016-09-05 ENCOUNTER — Telehealth: Payer: Self-pay | Admitting: *Deleted

## 2016-09-05 DIAGNOSIS — Z122 Encounter for screening for malignant neoplasm of respiratory organs: Secondary | ICD-10-CM

## 2016-09-05 DIAGNOSIS — Z87891 Personal history of nicotine dependence: Secondary | ICD-10-CM

## 2016-09-05 LAB — DRVVT CONFIRM: DRVVT CONFIRM: 1.4 ratio — AB (ref 0.8–1.2)

## 2016-09-05 LAB — ANTIPHOSPHOLIPID SYNDROME PROF
Anticardiolipin IgG: <9 GPL U/mL (ref 0–14)
Anticardiolipin IgM: <9 MPL U/mL (ref 0–12)
DRVVT: 88.6 s — AB (ref 0.0–47.0)
PTT LA: 37.4 s (ref 0.0–51.9)

## 2016-09-05 LAB — DRVVT MIX: DRVVT MIX: 67.4 s — AB (ref 0.0–47.0)

## 2016-09-05 NOTE — Telephone Encounter (Signed)
Received referral for initial lung cancer screening scan. Contacted patient and obtained smoking history,(current, 34 pack year) as well as answering questions related to screening process. Patient denies signs of lung cancer such as weight loss or hemoptysis. Patient denies comorbidity that would prevent curative treatment if lung cancer were found. Patient is scheduled for shared decision making visit and CT scan on 09/19/16.

## 2016-09-06 ENCOUNTER — Ambulatory Visit: Payer: 59

## 2016-09-07 LAB — FACTOR 5 LEIDEN

## 2016-09-07 LAB — PROTHROMBIN GENE MUTATION

## 2016-09-11 ENCOUNTER — Telehealth: Payer: Self-pay | Admitting: Internal Medicine

## 2016-09-11 NOTE — Telephone Encounter (Signed)
[employer] Faxed attending physicians statement  In [facility] in box

## 2016-09-11 NOTE — Telephone Encounter (Signed)
Done, given back to Robin 

## 2016-09-18 ENCOUNTER — Encounter: Payer: Self-pay | Admitting: Oncology

## 2016-09-18 ENCOUNTER — Inpatient Hospital Stay: Payer: 59 | Attending: Oncology | Admitting: Oncology

## 2016-09-18 VITALS — BP 147/93 | HR 96 | Temp 97.0°F | Resp 18 | Wt 209.4 lb

## 2016-09-18 DIAGNOSIS — D7589 Other specified diseases of blood and blood-forming organs: Secondary | ICD-10-CM | POA: Diagnosis not present

## 2016-09-18 DIAGNOSIS — E785 Hyperlipidemia, unspecified: Secondary | ICD-10-CM | POA: Diagnosis not present

## 2016-09-18 DIAGNOSIS — F17210 Nicotine dependence, cigarettes, uncomplicated: Secondary | ICD-10-CM | POA: Diagnosis not present

## 2016-09-18 DIAGNOSIS — Z716 Tobacco abuse counseling: Secondary | ICD-10-CM

## 2016-09-18 DIAGNOSIS — Z7901 Long term (current) use of anticoagulants: Secondary | ICD-10-CM | POA: Diagnosis not present

## 2016-09-18 DIAGNOSIS — I82402 Acute embolism and thrombosis of unspecified deep veins of left lower extremity: Secondary | ICD-10-CM | POA: Insufficient documentation

## 2016-09-18 DIAGNOSIS — Z7982 Long term (current) use of aspirin: Secondary | ICD-10-CM | POA: Diagnosis not present

## 2016-09-18 DIAGNOSIS — I10 Essential (primary) hypertension: Secondary | ICD-10-CM | POA: Insufficient documentation

## 2016-09-18 DIAGNOSIS — Z79899 Other long term (current) drug therapy: Secondary | ICD-10-CM | POA: Diagnosis not present

## 2016-09-18 DIAGNOSIS — H9193 Unspecified hearing loss, bilateral: Secondary | ICD-10-CM | POA: Insufficient documentation

## 2016-09-18 NOTE — Progress Notes (Signed)
Hematology/Oncology Follow up Visit. [facility] Telephone:([phone]) [phone] [phone]) [phone]  CONSULT NOTE Patient Care Team: Lorre MunroeBaity, Regina W, NP as PCP - General (Internal Medicine)  CHIEF COMPLAINTS/PURPOSE OF CONSULTATION:  I have leg clots.   HISTORY OF PRESENTING ILLNESS:  Walter Maddox 61 y.o.  male with past medical history listed as below was referred by his primary care provider Baity regina for evaluation and management of extremity DVT. Patient presented to emergency room on 08/16/2016 with complaints of lower extremity pain right side. Patient reports that his lower extremity pain started about a week ago prior to him presenting to the emergency room. Ultrasound duplex of bilateral lower extremity revealed acute DVT in the left distal common femoral, femoral popliteal, gastrocnemius, posterior tibial, and peroneal veins. No DVT on the right side. Patient was started on Xarelto for anticoagulation.  INTERVAL HISTORY Patient presents to discuss about the results. He continues to have hearing loss, feels that his ears are clogged. He denies any bleed events while taking Xarelto. His lower extremity pain has resolved. No swelling.   ROS:  Review of Systems  Constitutional: Negative.   HENT:  Negative.   Eyes: Negative.   Respiratory: Negative.   Cardiovascular: Negative.   Gastrointestinal: Negative.   Endocrine: Negative.   Genitourinary: Negative.    Musculoskeletal: Negative.   Skin: Negative.   Neurological: Negative.   Hematological: Negative.   Psychiatric/Behavioral: Negative.     MEDICAL HISTORY:  Past Medical History:  Diagnosis Date  . Back pain   . Chronic pain of right hip   . Dupuytren's contracture of both hands   . DVT of lower limb, acute (HCC) 08/17/2016   per pt left leg  . Elbow pain, chronic   . Hyperlipidemia   . Hypertension   . Severe flexion contractures of all joints 2009   bilateral hands some finger immobility  per pt    SURGICAL HISTORY: No past surgical history on file.  SOCIAL HISTORY: Social History   Social History  . Marital status: Divorced    Spouse name: N/A  . Number of children: N/A  . Years of education: N/A   Occupational History  . Not on file.   Social History Main Topics  . Smoking status: Current Every Day Smoker    Packs/day: 1.00    Years: 34.00    Types: Cigarettes  . Smokeless tobacco: Never Used     Comment: pt refd smoking cessation info  . Alcohol use Yes     Comment: beer socially  . Drug use: No  . Sexual activity: No   Other Topics Concern  . Not on file   Social History Narrative  . No narrative on file    FAMILY HISTORY: Family History  Problem Relation Age of Onset  . Heart disease Mother   . Parkinson's disease Mother   . Dementia Mother   . Pulmonary embolism Mother   . Diabetes Father   . Hypercholesterolemia Sister   . Hypercholesterolemia Sister   . Breast cancer Sister     ALLERGIES:  has No Known Allergies.  MEDICATIONS:  Current Outpatient Prescriptions  Medication Sig Dispense Refill  . ASPIRIN 81 PO Take 1 tablet by mouth daily.    Marland Kitchen. lisinopril-hydrochlorothiazide (PRINZIDE,ZESTORETIC) 10-12.5 MG tablet Take 1 tablet by mouth daily. 30 tablet 5  . rivaroxaban (XARELTO) 20 MG TABS tablet Take 1 tablet (20 mg total) by mouth daily with supper. 30 tablet 5  . ibuprofen (ADVIL,MOTRIN) 200 MG  tablet Take 200 mg by mouth every 6 (six) hours as needed.     No current facility-administered medications for this visit.       Marland Kitchen  PHYSICAL EXAMINATION: ECOG PERFORMANCE STATUS: 0 - Asymptomatic Vitals:   09/18/16 1004  BP: (!) 147/93  Pulse: 96  Resp: 18  Temp: (!) 97 F (36.1 C)   Filed Weights   09/18/16 1004  Weight: 209 lb 6.4 oz (95 kg)    GENERAL: Well-nourished well-developed; Alert, no distress and comfortable.  EYES: no pallor or icterus OROPHARYNX: no thrush or ulceration; good dentition  NECK: supple, no  masses felt LYMPH:  no palpable lymphadenopathy in the cervical, axillary or inguinal regions LUNGS: clear to auscultation and  No wheeze or crackles HEART/CVS: regular rate & rhythm and no murmurs; No lower extremity edema ABDOMEN: abdomen soft, non-tender and normal bowel sounds Musculoskeletal:no cyanosis of digits and no clubbing  PSYCH: alert & oriented x 3  NEURO: no focal motor/sensory deficits SKIN:  no rashes or significant lesions  LABORATORY DATA:  I have reviewed the data as listed Lab Results  Component Value Date   WBC 10.6 09/03/2016   HGB 17.2 09/03/2016   HCT 48.2 09/03/2016   MCV 93.2 09/03/2016   PLT 322 09/03/2016    Recent Labs  08/16/16 1448 08/31/16 1513  NA 137 138  K 4.9 3.9  CL 102 100  CO2 31 22  GLUCOSE 100* 103*  BUN 12 12  CREATININE 0.98 1.00  CALCIUM 9.6 9.5  PROT 7.5  --   ALBUMIN 4.0  --   AST 22  --   ALT 25  --   ALKPHOS 75  --   BILITOT 0.6  --     RADIOGRAPHIC STUDIES: I have personally reviewed the radiological images as listed and agreed with the findings in the report. No results found.  08/17/2016 Ultrasound duplex of bilateral lower extremity revealed acute DVT in the left distal common femoral, femoral popliteal, gastrocnemius, posterior tibial, and peroneal veins.  ASSESSMENT & PLAN:  1. Acute deep vein thrombosis (DVT) of left lower extremity, unspecified vein (HCC)   2. Tobacco abuse counseling   3. Macrocytosis   4. Bilateral hearing loss, unspecified hearing loss type     # He is tested negative for factor V Leiden mutation, prothrombin mutation,anti cardiolipin antibody. I helding checking protein C&S activity right now due to the acute setting.  # He is tested positive for lupus anticoagulant, however, I suspect this is likely a false positivity while on Xalreto. To confirm, will need to repeat test in 12 weeks.  # Continue Xarelto for anticoagulation. Duration of anticoagulation is long-term given nature of  unprovoked proximal DVT.   when he comes back in 3 months, will ask him to interrupt anticoagulation for 1-2 days and repeat anti coagulant panel.   # Macrocytosis,normal folate and B12 levels, macrocytosis resolved on today's lab. Neutrophillia likely due to smoking.   # Smoke cessation was discussed in detail with patient.Given his extensive smoking history, ordered CT chest without for lung cancer screening he will get it tomorrow.   # Hearing loss, refer to ENT Dr.McQueen for further evaluation.   Age appropriate cancer screening was discussed with patient. Patient is also due for colonoscopy which he prefer to be discussed after he has CT lung and ENT evaluation. Currently he denies any bowel habit changes. .  All questions were answered. The patient knows to call the clinic with any problems questions  or concerns.  Return of visit: 3 months or earlier if needed     Rickard Patience, MD, PhD Hematology Oncology [facility] Pager- [phone] 09/18/2016

## 2016-09-18 NOTE — Progress Notes (Signed)
Here for follow up. counseling info given to pt. Will consider talking to someone. Continues w congestion and limited hearing in both ears x 3 weeks. Encouraged to f/u w PCP.

## 2016-09-19 ENCOUNTER — Ambulatory Visit
Admission: RE | Admit: 2016-09-19 | Discharge: 2016-09-19 | Disposition: A | Discharge status: Home / Self Care | Payer: 59 | Source: Ambulatory Visit | Attending: Oncology | Admitting: Oncology

## 2016-09-19 ENCOUNTER — Inpatient Hospital Stay (HOSPITAL_BASED_OUTPATIENT_CLINIC_OR_DEPARTMENT_OTHER): Payer: 59 | Admitting: Oncology

## 2016-09-19 DIAGNOSIS — F17210 Nicotine dependence, cigarettes, uncomplicated: Secondary | ICD-10-CM | POA: Diagnosis not present

## 2016-09-19 DIAGNOSIS — Z122 Encounter for screening for malignant neoplasm of respiratory organs: Secondary | ICD-10-CM | POA: Diagnosis not present

## 2016-09-19 DIAGNOSIS — Z87891 Personal history of nicotine dependence: Secondary | ICD-10-CM

## 2016-09-19 DIAGNOSIS — J439 Emphysema, unspecified: Secondary | ICD-10-CM | POA: Diagnosis not present

## 2016-09-19 DIAGNOSIS — I700 Atherosclerosis of aorta: Secondary | ICD-10-CM | POA: Insufficient documentation

## 2016-09-19 NOTE — Progress Notes (Signed)
In accordance with CMS guidelines, patient has met eligibility criteria including age, absence of signs or symptoms of lung cancer.  Social History  Substance Use Topics  . Smoking status: Current Every Day Smoker    Packs/day: 1.00    Years: 34.00    Types: Cigarettes  . Smokeless tobacco: Never Used     Comment: pt refd smoking cessation info  . Alcohol use Yes     Comment: beer socially     A shared decision-making session was conducted prior to the performance of CT scan. This includes one or more decision aids, includes benefits and harms of screening, follow-up diagnostic testing, over-diagnosis, false positive rate, and total radiation exposure.  Counseling on the importance of adherence to annual lung cancer LDCT screening, impact of co-morbidities, and ability or willingness to undergo diagnosis and treatment is imperative for compliance of the program.  Counseling on the importance of continued smoking cessation for former smokers; the importance of smoking cessation for current smokers, and information about tobacco cessation interventions have been given to patient including [place] and [address] quit [place] programs.  Written order for lung cancer screening with LDCT has been given to the patient and any and all questions have been answered to the best of my abilities.   Yearly follow up will be coordinated by Burgess Estelle, Thoracic Navigator.  Lauren G. Zenia Resides, NP  09/19/16 2:52 PM

## 2016-09-21 ENCOUNTER — Encounter: Payer: Self-pay | Admitting: *Deleted

## 2016-12-10 ENCOUNTER — Telehealth: Payer: Self-pay | Admitting: Internal Medicine

## 2016-12-10 NOTE — Telephone Encounter (Signed)
I do not agree with the switch to coumadin. He would have to come in weekly until we got his levels correct, then it would be biweekly and then monthly.

## 2016-12-10 NOTE — Telephone Encounter (Signed)
I spoke with pt and he wants to change from xarelto to warfarin due to cost; pt does not have ins at this time but hopes to have some type ins by 12-29-16.[facility] [address]. I explained to pt usually have to have PT/INR cks periodically. Pt said would wait to see what Pamala Hurry Baity NP said about PT/INR appts; pt spoke with pharmacist and understood would only be one lab appt for PT/INR. Please advise. Pt last saw Pamala Hurry Baity NP on 08/31/16; pt has been seeing Dr Cathie HoopsYu.

## 2016-12-10 NOTE — Telephone Encounter (Signed)
Copied from CRM [id number]. Topic: Quick Communication - See Telephone Encounter >> Dec 10, 2016  1:07 PM Diana EvesHoyt, Maryann B wrote: CRM for notification. See Telephone encounter for:  Pt is currently taking xarelto to warfrin...also wanted to switch pharmacies I took care of the pharmacy switch  12/10/16.

## 2016-12-10 NOTE — Telephone Encounter (Signed)
Spoke to pt who states he cannot afford xarelto, and is wanting to know if there is an alternate outside of coumadin that is more economic. pls advise

## 2016-12-11 NOTE — Telephone Encounter (Signed)
What about patient assistance?

## 2016-12-12 NOTE — Telephone Encounter (Signed)
Patient reports he runs out Saturday and can not afford to get his RX refilled. I asked about Patient Assistance for Xarelto, and Shawna OrleansMelanie reports he did not qualify because of lack of insurance. I agree, with not using Coumadin for a matter of weeks. We could switch to Eliquis. Can we look into patient assistance for that and see if pt is able to pay for 1 more month of Xarelto why we figure this out?

## 2016-12-12 NOTE — Telephone Encounter (Signed)
Relation to pt: self  Call back number: [phone]  [id number] [place], [place] - [address] AT [address] [phone] (Phone) [phone] (Fax)     Reason for call:  Patient checking on the status of message below, patient will run out of medication on Saturday, please advise

## 2016-12-12 NOTE — Telephone Encounter (Signed)
Yes, I can do this but he has to be compliant and willing to come to regular coumadin visits and pay any copays that may require.  Another option to consider would be if he is appropriate to switch to Eliquis?  I have very good results with Eliquis patient assistance program as long as patient's income meets a certain requirement.  This has also helped a lot of my folks who have fallen int the donut with their insurance and bridges them until the first of the year when their insurance kicks back in.      This process usually takes up to 3-4 weeks and patient would still need to pay out of pocket in the meantime for the medication.  I am not sure what his plans are.  It sounds like he may have insurance on 12/29/16 so I do not think it wise to put him on coumadin just for 2 weeks.  I don't know how realistic that is or how much good that will do him.    If we move forward with Coumadin:  Recommend a 3 day overlap.  Start Coumadin 5mg  daily and take Xarelto in combo X 3days, then stop Xarelto, continuing 5mg  daily of coumadin and CHECK INR in 1 week.  Please advise how you would like for me to proceed with patient.

## 2016-12-12 NOTE — Telephone Encounter (Signed)
Walter Maddox,  Are you able to help me transition someone from Xarelto to Coumadin?

## 2016-12-12 NOTE — Telephone Encounter (Signed)
I looked on Xarelto website and when I put in that pt did not currently have insurance coverage, a message popped up that you were not qualified... Please advise

## 2016-12-13 MED ORDER — WARFARIN SODIUM 5 MG PO TABS: 5.0000 mg | ORAL_TABLET | Freq: Every day | ORAL | 0 refills | Status: DC

## 2016-12-13 NOTE — Addendum Note (Signed)
Addended by: Barrington EllisonWAGONER, Xyler Terpening C on: 12/13/2016 11:03 AM   Modules accepted: Orders

## 2016-12-13 NOTE — Telephone Encounter (Signed)
Discussed med options with patient.  He is now stating that his oncologist/hematologist wanted him switched to warfarin due to some recent lab findings regarding a "blood condition" that has been identified.  Patient and I reviewed warfarin plans and expectations and he verbalizes understanding and agreement with management policy.  Will review and confirm plan with Nicki Reaperegina Baity, NP to have patient take the Xarelto 3 day overlap with warfarin 5mg  daily and recheck on Tuesday, 12/18/16.  Patient will stop Xarelto on Sunday but continue daily warfarin.  Typically we do not bridge with lovenox until INR reaches a 2.0 unless there is very high concern for safety from the provider.    Nicki Reaperegina Baity, NP:  Please confirm approval with 3 day Xarelto overlap plan and indicate whether you would suggest a lovenox bridge plan until patient is therapeutic?  This is not usually necessary unless you have any special concerns for patient, but will defer to you for recommendation.   Also, Therapeutic range recommended for DVT is 2.0 -3.0.  If any abnormal blood mutation is identified, we may need to review this range.  For now, should we plan on 2.0 -3.0?

## 2016-12-13 NOTE — Telephone Encounter (Signed)
Confirmed okay to start Xarelto 3 day w/ coumadin plan with Pamala Hurry. Baity, NP.  Patient is aware of instructions and appointment for Tuesday 12/18/16.  Patient is stable and does not require lovenox bridge therapy and will keep patient on a 2.0 - 3.0 goal range due to single event DVT without PE hx.  Patient will be following up with hematology for repeat lab testing and can discuss more at that time if they recommend different reference ranges based on results of f/u blood mutation studies.    Warfarin R/x sent in for 5mg , #30, 0 refills.  Nicki Reaperegina Baity, NP: any further recommendations at this time, please advise?

## 2016-12-13 NOTE — Telephone Encounter (Signed)
Nothing further, thank you so much for your help.

## 2016-12-17 NOTE — Progress Notes (Deleted)
Hematology/Oncology Follow up Visit. [facility] Telephone:([phone] [phone]  CONSULT NOTE Patient Care Team: Lorre MunroeBaity, Regina W, NP as PCP - General (Internal Medicine)  CHIEF COMPLAINTS/PURPOSE OF CONSULTATION:  I have leg clots.   HISTORY OF PRESENTING ILLNESS:  Walter Maddox 61 y.o.  male with past medical history listed as below was referred by his primary care provider Baity regina for evaluation and management of extremity DVT. Patient presented to emergency room on 08/16/2016 with complaints of lower extremity pain right side. Patient reports that his lower extremity pain started about a week ago prior to him presenting to the emergency room. Ultrasound duplex of bilateral lower extremity revealed acute DVT in the left distal common femoral, femoral popliteal, gastrocnemius, posterior tibial, and peroneal veins. No DVT on the right side. Patient was started on Xarelto for anticoagulation.  INTERVAL HISTORY Patient presents to discuss about the results. He continues to have hearing loss, feels that his ears are clogged. He denies any bleed events while taking Xarelto. His lower extremity pain has resolved. No swelling.   ROS:  Review of Systems  Constitutional: Negative.   HENT:  Negative.   Eyes: Negative.   Respiratory: Negative.   Cardiovascular: Negative.   Gastrointestinal: Negative.   Endocrine: Negative.   Genitourinary: Negative.    Musculoskeletal: Negative.   Skin: Negative.   Neurological: Negative.   Hematological: Negative.   Psychiatric/Behavioral: Negative.     MEDICAL HISTORY:  Past Medical History:  Diagnosis Date  . Back pain   . Chronic pain of right hip   . Dupuytren's contracture of both hands   . DVT of lower limb, acute (HCC) 08/17/2016   per pt left leg  . Elbow pain, chronic   . Hyperlipidemia   . Hypertension   . Severe flexion contractures of all joints 2009   bilateral hands some finger immobility  per pt    SURGICAL HISTORY: No past surgical history on file.  SOCIAL HISTORY: Social History   Socioeconomic History  . Marital status: Divorced    Spouse name: Not on file  . Number of children: Not on file  . Years of education: Not on file  . Highest education level: Not on file  Social Needs  . Financial resource strain: Not on file  . Food insecurity - worry: Not on file  . Food insecurity - inability: Not on file  . Transportation needs - medical: Not on file  . Transportation needs - non-medical: Not on file  Occupational History  . Not on file  Tobacco Use  . Smoking status: Current Every Day Smoker    Packs/day: 1.00    Years: 34.00    Pack years: 34.00    Types: Cigarettes  . Smokeless tobacco: Never Used  . Tobacco comment: pt refd smoking cessation info  Substance and Sexual Activity  . Alcohol use: Yes    Comment: beer socially  . Drug use: No  . Sexual activity: No  Other Topics Concern  . Not on file  Social History Narrative  . Not on file    FAMILY HISTORY: Family History  Problem Relation Age of Onset  . Heart disease Mother   . Parkinson's disease Mother   . Dementia Mother   . Pulmonary embolism Mother   . Diabetes Father   . Hypercholesterolemia Sister   . Hypercholesterolemia Sister   . Breast cancer Sister     ALLERGIES:  has No Known Allergies.  MEDICATIONS:  Current Outpatient  Medications  Medication Sig Dispense Refill  . ASPIRIN 81 PO Take 1 tablet by mouth daily.    Marland Kitchen. lisinopril-hydrochlorothiazide (PRINZIDE,ZESTORETIC) 10-12.5 MG tablet Take 1 tablet by mouth daily. 30 tablet 5  . warfarin (COUMADIN) 5 MG tablet Take 1 tablet (5 mg total) by mouth daily. Or as directed by coumadin clinic. 30 tablet 0   No current facility-administered medications for this visit.       Marland Kitchen.  PHYSICAL EXAMINATION: ECOG PERFORMANCE STATUS: 0 - Asymptomatic There were no vitals filed for this visit. There were no vitals filed for this  visit.  GENERAL: Well-nourished well-developed; Alert, no distress and comfortable.  EYES: no pallor or icterus OROPHARYNX: no thrush or ulceration; good dentition  NECK: supple, no masses felt LYMPH:  no palpable lymphadenopathy in the cervical, axillary or inguinal regions LUNGS: clear to auscultation and  No wheeze or crackles HEART/CVS: regular rate & rhythm and no murmurs; No lower extremity edema ABDOMEN: abdomen soft, non-tender and normal bowel sounds Musculoskeletal:no cyanosis of digits and no clubbing  PSYCH: alert & oriented x 3  NEURO: no focal motor/sensory deficits SKIN:  no rashes or significant lesions  LABORATORY DATA:  I have reviewed the data as listed Lab Results  Component Value Date   WBC 10.6 09/03/2016   HGB 17.2 09/03/2016   HCT 48.2 09/03/2016   MCV 93.2 09/03/2016   PLT 322 09/03/2016   Recent Labs    08/16/16 1448 08/31/16 1513  NA 137 138  K 4.9 3.9  CL 102 100  CO2 31 22  GLUCOSE 100* 103*  BUN 12 12  CREATININE 0.98 1.00  CALCIUM 9.6 9.5  PROT 7.5  --   ALBUMIN 4.0  --   AST 22  --   ALT 25  --   ALKPHOS 75  --   BILITOT 0.6  --     RADIOGRAPHIC STUDIES: I have personally reviewed the radiological images as listed and agreed with the findings in the report. No results found.  08/17/2016 Ultrasound duplex of bilateral lower extremity revealed acute DVT in the left distal common femoral, femoral popliteal, gastrocnemius, posterior tibial, and peroneal veins.  ASSESSMENT & PLAN:  No diagnosis found.  # He is tested negative for factor V Leiden mutation, prothrombin mutation,anti cardiolipin antibody. I helding checking protein C&S activity right now due to the acute setting.  # He is tested positive for lupus anticoagulant, however, I suspect this is likely a false positivity while on Xalreto. To confirm, will need to repeat test in 12 weeks.  # Continue Xarelto for anticoagulation. Duration of anticoagulation is long-term given  nature of unprovoked proximal DVT.   when he comes back in 3 months, will ask him to interrupt anticoagulation for 1-2 days and repeat anti coagulant panel.   # Macrocytosis,normal folate and B12 levels, macrocytosis resolved on today's lab. Neutrophillia likely due to smoking.   # Smoke cessation was discussed in detail with patient.Given his extensive smoking history, ordered CT chest without for lung cancer screening he will get it tomorrow.   # Hearing loss, refer to ENT Dr.McQueen for further evaluation.   Age appropriate cancer screening was discussed with patient. Patient is also due for colonoscopy which he prefer to be discussed after he has CT lung and ENT evaluation. Currently he denies any bowel habit changes. .  All questions were answered. The patient knows to call the clinic with any problems questions or concerns.  Return of visit: 3 months or earlier  if needed     Rickard Patience, MD, PhD Hematology Oncology [facility] Pager- [phone]

## 2016-12-18 ENCOUNTER — Ambulatory Visit (INDEPENDENT_AMBULATORY_CARE_PROVIDER_SITE_OTHER): Payer: Self-pay

## 2016-12-18 DIAGNOSIS — Z7901 Long term (current) use of anticoagulants: Secondary | ICD-10-CM

## 2016-12-18 DIAGNOSIS — I82402 Acute embolism and thrombosis of unspecified deep veins of left lower extremity: Secondary | ICD-10-CM

## 2016-12-18 LAB — POCT INR: INR: 1.5

## 2016-12-18 NOTE — Patient Instructions (Addendum)
INR today is 1.5  Take 1 1/2 pills (7.5mg ) today 12/4 and then resume taking 1 pill (5mg ) daily until recheck on Friday 12/21/16.    Patient is a new start to coumadin therapy.  He is 5 days out on a transition from Xarelto to Warfarin 5mg  daily.  He is doing well and denies any concerning changes in his diet, health or medication history and no abnormal bruising or bleeding.    Educational materials provided on coumadin and we spent several minutes reviewing safety concerns and importance of careful, strict diet and medication adherence.  He is aware of general risks associated with sub and supratherapeutic levels and reviewed concerning interactions with alcohol with increased bleeding risks.    Patient given time to ask any questions and does verbalize understanding of all instructions given today.

## 2016-12-19 ENCOUNTER — Inpatient Hospital Stay: Payer: Self-pay | Admitting: Oncology

## 2016-12-21 ENCOUNTER — Ambulatory Visit (INDEPENDENT_AMBULATORY_CARE_PROVIDER_SITE_OTHER): Payer: Self-pay

## 2016-12-21 DIAGNOSIS — I82402 Acute embolism and thrombosis of unspecified deep veins of left lower extremity: Secondary | ICD-10-CM

## 2016-12-21 DIAGNOSIS — Z7901 Long term (current) use of anticoagulants: Secondary | ICD-10-CM

## 2016-12-21 LAB — POCT INR: INR: 1.7

## 2016-12-21 NOTE — Patient Instructions (Addendum)
INR today is 1.7  New start to Coumadin on 12/13/16.  Transitioned from Xarelto.    Take 1 1/2 pills (7.5mg ) today 12/7 for a boost and then take 1 pill (5mg ) daily EXCEPT for 1 1/2 pills (7.5mg ) on Tuesdays.  Recheck in 1 week on 12/27/16.   Patient verbalizes understanding of written instructions given today.

## 2016-12-27 ENCOUNTER — Ambulatory Visit (INDEPENDENT_AMBULATORY_CARE_PROVIDER_SITE_OTHER): Payer: Self-pay | Admitting: General Practice

## 2016-12-27 DIAGNOSIS — Z7901 Long term (current) use of anticoagulants: Secondary | ICD-10-CM

## 2016-12-27 LAB — POCT INR: INR: 2.4

## 2016-12-27 NOTE — Patient Instructions (Addendum)
Pre visit review using our clinic review tool, if applicable. No additional management support is needed unless otherwise documented below in the visit note.  New start to Coumadin on 01/12/17.  Transitioned from Xarelto.    Continue to take 1 pill (5mg ) daily EXCEPT for 1 1/2 pills (7.5mg ) on Tuesdays.  Recheck in 1 week.

## 2017-01-03 ENCOUNTER — Other Ambulatory Visit: Payer: Self-pay | Admitting: General Practice

## 2017-01-03 ENCOUNTER — Ambulatory Visit (INDEPENDENT_AMBULATORY_CARE_PROVIDER_SITE_OTHER): Payer: Self-pay | Admitting: General Practice

## 2017-01-03 DIAGNOSIS — Z7901 Long term (current) use of anticoagulants: Secondary | ICD-10-CM

## 2017-01-03 LAB — POCT INR: INR: 2.7

## 2017-01-03 MED ORDER — WARFARIN SODIUM 5 MG PO TABS: 5.0000 mg | ORAL_TABLET | Freq: Every day | ORAL | 3 refills | Status: DC

## 2017-01-03 NOTE — Patient Instructions (Addendum)
Pre visit review using our clinic review tool, if applicable. No additional management support is needed unless otherwise documented below in the visit note.  New start to Coumadin on 01/12/17.  Transitioned from Xarelto.    Continue to take 1 pill (5mg ) daily EXCEPT for 1 1/2 pills (7.5mg ) on Tuesdays.  Recheck in 2 week.

## 2017-01-17 ENCOUNTER — Ambulatory Visit (INDEPENDENT_AMBULATORY_CARE_PROVIDER_SITE_OTHER): Payer: Self-pay | Admitting: General Practice

## 2017-01-17 DIAGNOSIS — Z7901 Long term (current) use of anticoagulants: Secondary | ICD-10-CM

## 2017-01-17 LAB — POCT INR: INR: 2.7

## 2017-01-17 NOTE — Patient Instructions (Addendum)
Pre visit review using our clinic review tool, if applicable. No additional management support is needed unless otherwise documented below in the visit note.mh  Continue to take 1 pill (5mg ) daily EXCEPT for 1 1/2 pills (7.5mg ) on Tuesdays.  Recheck in 4 week

## 2017-02-09 ENCOUNTER — Other Ambulatory Visit: Payer: Self-pay | Admitting: Internal Medicine

## 2017-02-13 ENCOUNTER — Telehealth: Payer: Self-pay | Admitting: Internal Medicine

## 2017-02-13 MED ORDER — LISINOPRIL-HYDROCHLOROTHIAZIDE 10-12.5 MG PO TABS: 1.0000 | ORAL_TABLET | Freq: Every day | ORAL | 0 refills | Status: DC

## 2017-02-13 NOTE — Telephone Encounter (Signed)
Copied from CRM [id number]. Topic: Quick Communication - Rx Refill/Question >> Feb 13, 2017  9:33 AM Lelon FrohlichGolden, Walter, RMA wrote: Medication: lisinopril  HCTZ 10-12.5mg    Has the patient contacted their pharmacy? yes   (Agent: If no, request that the patient contact the pharmacy for the refill.)   Preferred Pharm[facility] (with phone number or street name): [facility] [address]  Agent: Please be advised that RX refills may take up to 3 business days. We ask that you follow-up with your pharmacy.

## 2017-02-14 ENCOUNTER — Ambulatory Visit (INDEPENDENT_AMBULATORY_CARE_PROVIDER_SITE_OTHER): Payer: Self-pay | Admitting: General Practice

## 2017-02-14 DIAGNOSIS — Z7901 Long term (current) use of anticoagulants: Secondary | ICD-10-CM

## 2017-02-14 LAB — POCT INR: INR: 2.1

## 2017-02-14 NOTE — Patient Instructions (Addendum)
Pre visit review using our clinic review tool, if applicable. No additional management support is needed unless otherwise documented below in the visit note.  Take extra 1/2 tablet today and then continue to take 1 pill (5mg ) daily EXCEPT for 1 1/2 pills (7.5mg ) on Tuesdays.  Recheck in 4 week

## 2017-03-05 ENCOUNTER — Encounter: Payer: 59 | Admitting: Internal Medicine

## 2017-03-13 ENCOUNTER — Other Ambulatory Visit: Payer: Self-pay | Admitting: Internal Medicine

## 2017-03-14 ENCOUNTER — Ambulatory Visit: Payer: Self-pay | Admitting: General Practice

## 2017-03-14 DIAGNOSIS — Z7901 Long term (current) use of anticoagulants: Secondary | ICD-10-CM

## 2017-03-14 LAB — POCT INR: INR: 2.5

## 2017-03-14 NOTE — Patient Instructions (Addendum)
Pre visit review using our clinic review tool, if applicable. No additional management support is needed unless otherwise documented below in the visit note.  Continue to take 1 pill (5mg ) daily EXCEPT for 1 1/2 pills (7.5mg ) on Tuesdays.  Recheck in 6 week.

## 2017-04-13 ENCOUNTER — Other Ambulatory Visit: Payer: Self-pay | Admitting: Internal Medicine

## 2017-04-25 ENCOUNTER — Ambulatory Visit (INDEPENDENT_AMBULATORY_CARE_PROVIDER_SITE_OTHER): Payer: Self-pay | Admitting: General Practice

## 2017-04-25 DIAGNOSIS — Z7901 Long term (current) use of anticoagulants: Secondary | ICD-10-CM

## 2017-04-25 LAB — POCT INR: INR: 2.2

## 2017-04-25 NOTE — Patient Instructions (Addendum)
Pre visit review using our clinic review tool, if applicable. No additional management support is needed unless otherwise documented below in the visit note.  Continue to take 1 pill (5mg) daily EXCEPT for 1 1/2 pills (7.5mg) on Tuesdays.  Recheck in 6 week.    

## 2017-05-15 ENCOUNTER — Other Ambulatory Visit: Payer: Self-pay | Admitting: Internal Medicine

## 2017-06-06 ENCOUNTER — Ambulatory Visit (INDEPENDENT_AMBULATORY_CARE_PROVIDER_SITE_OTHER): Payer: Self-pay | Admitting: General Practice

## 2017-06-06 DIAGNOSIS — Z7901 Long term (current) use of anticoagulants: Secondary | ICD-10-CM

## 2017-06-06 DIAGNOSIS — I82402 Acute embolism and thrombosis of unspecified deep veins of left lower extremity: Secondary | ICD-10-CM

## 2017-06-06 LAB — POCT INR: INR: 3.7 — AB (ref 2.0–3.0)

## 2017-06-06 NOTE — Patient Instructions (Addendum)
Pre visit review using our clinic review tool, if applicable. No additional management support is needed unless otherwise documented below in the visit note.  Hold coumadin today and then continue to take 1 pill ( ) daily EXCEPT for 1 1/2 pills (7.5mg ) on Tuesdays.  Recheck in 4 week.

## 2017-06-17 ENCOUNTER — Other Ambulatory Visit: Payer: Self-pay | Admitting: Internal Medicine

## 2017-07-02 ENCOUNTER — Encounter: Payer: Self-pay | Admitting: Internal Medicine

## 2017-07-09 ENCOUNTER — Ambulatory Visit (INDEPENDENT_AMBULATORY_CARE_PROVIDER_SITE_OTHER): Payer: Self-pay | Admitting: Internal Medicine

## 2017-07-09 ENCOUNTER — Encounter: Payer: Self-pay | Admitting: Internal Medicine

## 2017-07-09 VITALS — BP 136/80 | HR 100 | Temp 98.2°F | Ht 71.0 in | Wt 208.0 lb

## 2017-07-09 DIAGNOSIS — Z0000 Encounter for general adult medical examination without abnormal findings: Secondary | ICD-10-CM

## 2017-07-09 DIAGNOSIS — E7800 Pure hypercholesterolemia, unspecified: Secondary | ICD-10-CM

## 2017-07-09 DIAGNOSIS — I10 Essential (primary) hypertension: Secondary | ICD-10-CM

## 2017-07-09 DIAGNOSIS — I82402 Acute embolism and thrombosis of unspecified deep veins of left lower extremity: Secondary | ICD-10-CM

## 2017-07-09 LAB — BASIC METABOLIC PANEL
BUN: 9 mg/dL (ref 6–23)
CHLORIDE: 97 meq/L (ref 96–112)
CO2: 29 mEq/L (ref 19–32)
Calcium: 9.9 mg/dL (ref 8.4–10.5)
Creatinine, Ser: 0.96 mg/dL (ref 0.40–1.50)
GFR: 84.24 mL/min (ref >60.00)
Glucose, Bld: 113 mg/dL — ABNORMAL HIGH (ref 70–99)
POTASSIUM: 4.6 meq/L (ref 3.5–5.1)
Sodium: 134 mEq/L — ABNORMAL LOW (ref 135–145)

## 2017-07-09 NOTE — Progress Notes (Signed)
Subjective:    Patient ID: Walter Maddox, male    DOB: [birth year], 62 y.o.   MRN: [id number]  HPI  Pt presents to the clinic today for his annual exam.   HTN: His BP today is 136/80. He is taking Lisinopril HCT as prescribed. ECG from 08/2016 reviewed.  History of DVT: Occurred 07/2016. He is still on Coumadin, following with the coumadin clinic.  HLD: His last LDL was 119, 08/2016. He is not taking any cholesterol lowering medication at this time. He tries to avoid fried foods.   Flu: never Tetanus: never Zostovax: never Shingrix: never PSA Screening: never Colon Screening: 2007 Vision Screening: as needed Dentist: as needed  Diet: He does eat meat. He consumes fruits and veggies (no greens) daily. He occasionally eats fried foods. He drinks mostly water, soda. Exercise: Push mow the yard, weekly.  Review of Systems      Past Medical History:  Diagnosis Date  . Back pain   . Chronic pain of right hip   . Dupuytren's contracture of both hands   . DVT of lower limb, acute (HCC) 08/17/2016   per pt left leg  . Elbow pain, chronic   . Hyperlipidemia   . Hypertension   . Severe flexion contractures of all joints 2009   bilateral hands some finger immobility per pt    Current Outpatient Medications  Medication Sig Dispense Refill  . ASPIRIN 81 PO Take 1 tablet by mouth daily.    Marland Kitchen. lisinopril-hydrochlorothiazide (PRINZIDE,ZESTORETIC) 10-12.5 MG tablet TAKE 1 TABLET BY MOUTH DAILY. MUST SCHEDULE ANNUAL PHYSICAL FOR REFILLS 30 tablet 0  . lisinopril-hydrochlorothiazide (PRINZIDE,ZESTORETIC) 10-12.5 MG tablet TAKE 1 TABLET BY MOUTH DAILY 30 tablet 0  . warfarin (COUMADIN) 5 MG tablet TAKE 1 TABLET(5 MG) BY MOUTH DAILY OR AS DIRECTED BY COUMADIN CLINIC 35 tablet 0   No current facility-administered medications for this visit.     No Known Allergies  Family History  Problem Relation Age of Onset  . Heart disease Mother   . Parkinson's disease Mother   . Dementia  Mother   . Pulmonary embolism Mother   . Diabetes Father   . Hypercholesterolemia Sister   . Hypercholesterolemia Sister   . Breast cancer Sister     Social History   Socioeconomic History  . Marital status: Divorced    Spouse name: Not on file  . Number of children: Not on file  . Years of education: Not on file  . Highest education level: Not on file  Occupational History  . Not on file  Social Needs  . Financial resource strain: Not on file  . Food insecurity:    Worry: Not on file    Inability: Not on file  . Transportation needs:    Medical: Not on file    Non-medical: Not on file  Tobacco Use  . Smoking status: Current Every Day Smoker    Packs/day: 1.00    Years: 34.00    Pack years: 34.00    Types: Cigarettes  . Smokeless tobacco: Never Used  . Tobacco comment: pt refd smoking cessation info  Substance and Sexual Activity  . Alcohol use: Yes    Comment: beer socially  . Drug use: No  . Sexual activity: Never  Lifestyle  . Physical activity:    Days per week: Not on file    Minutes per session: Not on file  . Stress: Not on file  Relationships  . Social connections:    Talks  on phone: Not on file    Gets together: Not on file    Attends religious service: Not on file    Active member of club or organization: Not on file    Attends meetings of clubs or organizations: Not on file    Relationship status: Not on file  . Intimate partner violence:    Fear of current or ex partner: Not on file    Emotionally abused: Not on file    Physically abused: Not on file    Forced sexual activity: Not on file  Other Topics Concern  . Not on file  Social History Narrative  . Not on file     Constitutional: Denies fever, malaise, fatigue, headache or abrupt weight changes.  HEENT: Denies eye pain, eye redness, ear pain, ringing in the ears, wax buildup, runny nose, nasal congestion, bloody nose, or sore throat. Respiratory: Denies difficulty breathing, shortness  of breath, cough or sputum production.   Cardiovascular: Denies chest pain, chest tightness, palpitations or swelling in the hands or feet.  Gastrointestinal: Denies abdominal pain, bloating, constipation, diarrhea or blood in the stool.  GU: Denies urgency, frequency, pain with urination, burning sensation, blood in urine, odor or discharge. Musculoskeletal: Denies decrease in range of motion, difficulty with gait, muscle pain or joint pain and swelling.  Skin: Denies redness, rashes, lesions or ulcercations.  Neurological: Denies dizziness, difficulty with memory, difficulty with speech or problems with balance and coordination.  Psych: Denies anxiety, depression, SI/HI.  No other specific complaints in a complete review of systems (except as listed in HPI above).  Objective:   Physical Exam  BP 136/80   Pulse 100   Temp 98.2 F (36.8 C) (Oral)   Ht 5\' 11"  (1.803 m)   Wt 208 lb (94.3 kg)   SpO2 98%   BMI 29.01 kg/m  Wt Readings from Last 3 Encounters:  07/09/17 208 lb (94.3 kg)  09/19/16 209 lb (94.8 kg)  09/18/16 209 lb 6.4 oz (95 kg)    General: Appears his stated age, well developed, well nourished in NAD. Skin: Warm, dry and intact.  HEENT: Head: normal shape and size; Eyes: sclera white, no icterus, conjunctiva pink, PERRLA and EOMs intact; Ears: bilateral cerumen impaction; Nose: mucosa pink and moist, septum midline; Throat/Mouth: Teeth present, mucosa pink and moist, no exudate, lesions or ulcerations noted.  Neck:  Neck supple, trachea midline. No masses, lumps or thyromegaly present.  Cardiovascular: Normal rate and rhythm. S1,S2 noted.  No murmur, rubs or gallops noted. No JVD or BLE edema. No carotid bruits noted. Pulmonary/Chest: Normal effort and positive vesicular breath sounds. No respiratory distress. No wheezes, rales or ronchi noted.  Abdomen: Soft and nontender. Normal bowel sounds. Ventral noted. Liver, spleen and kidneys non palpable. Musculoskeletal:  Strength 5/5 BUE/BLE. No difficulty with gait.  Neurological: Alert and oriented. Cranial nerves II-XII grossly intact. Coordination normal.  Psychiatric: Mood and affect normal. Behavior is normal. Judgment and thought content normal.     BMET    Component Value Date/Time   NA 138 08/31/2016 1513   K 3.9 08/31/2016 1513   CL 100 08/31/2016 1513   CO2 22 08/31/2016 1513   GLUCOSE 103 (H) 08/31/2016 1513   BUN 12 08/31/2016 1513   CREATININE 1.00 08/31/2016 1513   CALCIUM 9.5 08/31/2016 1513    Lipid Panel     Component Value Date/Time   CHOL 180 08/16/2016 1448   TRIG 98.0 08/16/2016 1448   HDL 41.60 08/16/2016 1448  CHOLHDL 4 08/16/2016 1448   VLDL 19.6 08/16/2016 1448   LDLCALC 119 (H) 08/16/2016 1448    CBC    Component Value Date/Time   WBC 10.6 09/03/2016 1542   RBC 5.17 09/03/2016 1542   HGB 17.2 09/03/2016 1542   HCT 48.2 09/03/2016 1542   PLT 322 09/03/2016 1542   MCV 93.2 09/03/2016 1542   MCH 33.2 09/03/2016 1542   MCHC 35.6 09/03/2016 1542   RDW 12.4 09/03/2016 1542   LYMPHSABS 2.2 09/03/2016 1542   MONOABS 1.0 09/03/2016 1542   EOSABS 0.1 09/03/2016 1542   BASOSABS 0.1 09/03/2016 1542    Hgb A1C Lab Results  Component Value Date   HGBA1C 5.6 08/16/2016            Assessment & Plan:   Preventative Health Maintenance:  He declines flu shot, tetanus, zostovax and shingrix He declines PSA screening or colonoscopy Encouraged him to consume a balanced diet and exercise regimen Advised him to see an eye doctor and dentist annually Will check CMET today  RTC in 1 year, sooner if needed Nicki Reaper, NP

## 2017-07-09 NOTE — Assessment & Plan Note (Signed)
Encouraged him to consume a low fat diet No lipid panel today due to lack of insurance Will monitor

## 2017-07-09 NOTE — Assessment & Plan Note (Signed)
Reinforced DASH diet and exercise for weight loss CMET today Continue Lisinopril HCT for now

## 2017-07-09 NOTE — Assessment & Plan Note (Signed)
Still on Coumadin Will d/w Mandy @ coumadin clinic a plan to when we can stop Coumadin

## 2017-07-09 NOTE — Patient Instructions (Signed)

## 2017-07-11 ENCOUNTER — Ambulatory Visit: Payer: Self-pay

## 2017-07-11 ENCOUNTER — Ambulatory Visit (INDEPENDENT_AMBULATORY_CARE_PROVIDER_SITE_OTHER): Payer: Self-pay | Admitting: General Practice

## 2017-07-11 DIAGNOSIS — Z7901 Long term (current) use of anticoagulants: Secondary | ICD-10-CM

## 2017-07-11 LAB — POCT INR: INR: 3.2 — AB (ref 2.0–3.0)

## 2017-07-11 MED ORDER — LISINOPRIL-HYDROCHLOROTHIAZIDE 10-12.5 MG PO TABS
1.0000 | ORAL_TABLET | Freq: Every day | ORAL | 10 refills | Status: DC
Start: 2017-07-11 — End: 2018-06-17

## 2017-07-11 NOTE — Addendum Note (Signed)
Addended by: Roena MaladyEVONTENNO, Naiyah Klostermann Y on: 07/11/2017 03:06 PM   Modules accepted: Orders

## 2017-07-11 NOTE — Patient Instructions (Addendum)
Pre visit review using our clinic review tool, if applicable. No additional management support is needed unless otherwise documented below in the visit note.  Hold coumadin today and then take 1 pill (5mg ) daily.  Recheck in 4 week.

## 2017-07-13 ENCOUNTER — Other Ambulatory Visit: Payer: Self-pay | Admitting: Internal Medicine

## 2017-08-08 ENCOUNTER — Ambulatory Visit: Payer: Self-pay

## 2017-09-07 ENCOUNTER — Telehealth: Payer: Self-pay

## 2017-09-07 NOTE — Telephone Encounter (Signed)
Call pt regarding lung screening left message at 10:43.

## 2017-09-09 ENCOUNTER — Telehealth: Payer: Self-pay | Admitting: *Deleted

## 2017-09-09 NOTE — Telephone Encounter (Signed)
Attempted to contact patient r/t LDCT Screening follow up due at this time.  No answer received, message left for patient to call [phone] to schedule appointment.    

## 2017-09-10 ENCOUNTER — Telehealth: Payer: Self-pay | Admitting: *Deleted

## 2017-09-10 DIAGNOSIS — Z122 Encounter for screening for malignant neoplasm of respiratory organs: Secondary | ICD-10-CM

## 2017-09-10 NOTE — Telephone Encounter (Signed)
Patient has been notified that the annual lung cancer screening low dose CT scan is due currently or will be in the near future.  Confirmed that the patient is within the age range of 6355-80, and asymptomatic, and currently exhibits no signs or symptoms of lung cancer.  Patient denies illness that would prevent curative treatment for lung cancer if found.  Verified smoking history, current smoker 1/2 ppd now but has a 45 pkyr history  The shared decision making visit was completed on 09-19-16.   Patient is agreeable for the CT scan to be scheduled.  Will call patient back with date and time of appointment.

## 2017-09-12 ENCOUNTER — Ambulatory Visit (INDEPENDENT_AMBULATORY_CARE_PROVIDER_SITE_OTHER): Payer: Self-pay | Admitting: General Practice

## 2017-09-12 DIAGNOSIS — Z7901 Long term (current) use of anticoagulants: Secondary | ICD-10-CM

## 2017-09-12 LAB — POCT INR: INR: 4.5 — AB (ref 2.0–3.0)

## 2017-09-12 NOTE — Patient Instructions (Addendum)
Pre visit review using our clinic review tool, if applicable. No additional management support is needed unless otherwise documented below in the visit note.  Hold coumadin today and tomorrow and then take 1 pill (5mg ) daily.  Recheck in 4 week.

## 2017-09-17 ENCOUNTER — Other Ambulatory Visit: Payer: Self-pay | Admitting: Internal Medicine

## 2017-09-17 ENCOUNTER — Telehealth: Payer: Self-pay | Admitting: *Deleted

## 2017-09-17 NOTE — Telephone Encounter (Signed)
Called patient to discuss appt for LDCT screening on Monday 09/23/2017 @ 11:05am here @ [facility].  Message left for patient appts mailed

## 2017-09-23 ENCOUNTER — Ambulatory Visit
Admission: RE | Admit: 2017-09-23 | Discharge: 2017-09-23 | Disposition: A | Discharge status: Home / Self Care | Payer: Self-pay | Source: Ambulatory Visit | Attending: Oncology | Admitting: Oncology

## 2017-09-23 DIAGNOSIS — J432 Centrilobular emphysema: Secondary | ICD-10-CM | POA: Insufficient documentation

## 2017-09-23 DIAGNOSIS — I2510 Atherosclerotic heart disease of native coronary artery without angina pectoris: Secondary | ICD-10-CM | POA: Insufficient documentation

## 2017-09-23 DIAGNOSIS — Z122 Encounter for screening for malignant neoplasm of respiratory organs: Secondary | ICD-10-CM | POA: Insufficient documentation

## 2017-09-23 DIAGNOSIS — I700 Atherosclerosis of aorta: Secondary | ICD-10-CM | POA: Insufficient documentation

## 2017-09-23 DIAGNOSIS — Z87891 Personal history of nicotine dependence: Secondary | ICD-10-CM | POA: Insufficient documentation

## 2017-09-26 ENCOUNTER — Encounter: Payer: Self-pay | Admitting: *Deleted

## 2017-10-03 ENCOUNTER — Ambulatory Visit (INDEPENDENT_AMBULATORY_CARE_PROVIDER_SITE_OTHER): Payer: Self-pay | Admitting: General Practice

## 2017-10-03 DIAGNOSIS — Z7901 Long term (current) use of anticoagulants: Secondary | ICD-10-CM

## 2017-10-03 LAB — POCT INR: INR: 3 (ref 2.0–3.0)

## 2017-10-03 NOTE — Patient Instructions (Addendum)
Pre visit review using our clinic review tool, if applicable. No additional management support is needed unless otherwise documented below in the visit note.  Continue to take 1 pill (5mg) daily.  Recheck in 4 weeks.  

## 2017-10-31 ENCOUNTER — Ambulatory Visit (INDEPENDENT_AMBULATORY_CARE_PROVIDER_SITE_OTHER): Payer: Self-pay | Admitting: General Practice

## 2017-10-31 DIAGNOSIS — Z7901 Long term (current) use of anticoagulants: Secondary | ICD-10-CM

## 2017-10-31 LAB — POCT INR: INR: 3.2 — AB (ref 2.0–3.0)

## 2017-10-31 NOTE — Patient Instructions (Signed)
Pre visit review using our clinic review tool, if applicable. No additional management support is needed unless otherwise documented below in the visit note.  Hold coumadin today (10/17) and then continue to take 1 pill (5mg ) daily.  Recheck in 4 weeks.

## 2017-12-05 ENCOUNTER — Ambulatory Visit (INDEPENDENT_AMBULATORY_CARE_PROVIDER_SITE_OTHER): Payer: Self-pay | Admitting: General Practice

## 2017-12-05 DIAGNOSIS — Z7901 Long term (current) use of anticoagulants: Secondary | ICD-10-CM

## 2017-12-05 LAB — POCT INR: INR: 2.3 (ref 2.0–3.0)

## 2017-12-05 NOTE — Patient Instructions (Addendum)
Pre visit review using our clinic review tool, if applicable. No additional management support is needed unless otherwise documented below in the visit note.  Continue to take 1 pill (5mg) daily.  Recheck in 4 weeks.  

## 2018-01-23 ENCOUNTER — Ambulatory Visit (INDEPENDENT_AMBULATORY_CARE_PROVIDER_SITE_OTHER): Payer: Self-pay | Admitting: General Practice

## 2018-01-23 DIAGNOSIS — Z7901 Long term (current) use of anticoagulants: Secondary | ICD-10-CM

## 2018-01-23 LAB — POCT INR: INR: 2 (ref 2.0–3.0)

## 2018-01-23 NOTE — Patient Instructions (Addendum)
Pre visit review using our clinic review tool, if applicable. No additional management support is needed unless otherwise documented below in the visit note.  Continue to take 1 pill (5mg) daily.  Recheck in 6 weeks 

## 2018-02-14 ENCOUNTER — Other Ambulatory Visit: Payer: Self-pay | Admitting: Internal Medicine

## 2018-03-06 ENCOUNTER — Ambulatory Visit: Payer: Self-pay | Admitting: General Practice

## 2018-03-06 DIAGNOSIS — Z7901 Long term (current) use of anticoagulants: Secondary | ICD-10-CM

## 2018-03-06 LAB — POCT INR: INR: 3.1 — AB (ref 2.0–3.0)

## 2018-03-06 NOTE — Patient Instructions (Addendum)
Pre visit review using our clinic review tool, if applicable. No additional management support is needed unless otherwise documented below in the visit note.  Take 1/2 tablet today and then continue to take 1 pill (5mg ) daily.  Recheck in 6 weeks.

## 2018-04-17 ENCOUNTER — Other Ambulatory Visit: Payer: Self-pay

## 2018-04-17 ENCOUNTER — Ambulatory Visit (INDEPENDENT_AMBULATORY_CARE_PROVIDER_SITE_OTHER): Payer: Self-pay | Admitting: General Practice

## 2018-04-17 ENCOUNTER — Other Ambulatory Visit (INDEPENDENT_AMBULATORY_CARE_PROVIDER_SITE_OTHER): Payer: Self-pay

## 2018-04-17 DIAGNOSIS — Z7901 Long term (current) use of anticoagulants: Secondary | ICD-10-CM

## 2018-04-17 DIAGNOSIS — I82492 Acute embolism and thrombosis of other specified deep vein of left lower extremity: Secondary | ICD-10-CM

## 2018-04-17 LAB — POCT INR: INR: 2.3 (ref 2.0–3.0)

## 2018-04-17 NOTE — Patient Instructions (Signed)
Pre visit review using our clinic review tool, if applicable. No additional management support is needed unless otherwise documented below in the visit note.  Continue to take 1 pill (5mg) daily.  Recheck in 6 weeks 

## 2018-05-29 ENCOUNTER — Other Ambulatory Visit (INDEPENDENT_AMBULATORY_CARE_PROVIDER_SITE_OTHER): Payer: Self-pay

## 2018-05-29 ENCOUNTER — Ambulatory Visit (INDEPENDENT_AMBULATORY_CARE_PROVIDER_SITE_OTHER): Payer: Self-pay | Admitting: General Practice

## 2018-05-29 ENCOUNTER — Other Ambulatory Visit: Payer: Self-pay

## 2018-05-29 DIAGNOSIS — I824Z2 Acute embolism and thrombosis of unspecified deep veins of left distal lower extremity: Secondary | ICD-10-CM

## 2018-05-29 DIAGNOSIS — Z7901 Long term (current) use of anticoagulants: Secondary | ICD-10-CM

## 2018-05-29 DIAGNOSIS — I82442 Acute embolism and thrombosis of left tibial vein: Secondary | ICD-10-CM

## 2018-05-29 LAB — POCT INR: INR: 2.9 (ref 2.0–3.0)

## 2018-05-29 NOTE — Patient Instructions (Signed)
Pre visit review using our clinic review tool, if applicable. No additional management support is needed unless otherwise documented below in the visit note.  Continue to take 1 pill (5mg) daily.  Recheck in 6 weeks 

## 2018-06-14 ENCOUNTER — Other Ambulatory Visit: Payer: Self-pay | Admitting: Internal Medicine

## 2018-07-09 ENCOUNTER — Other Ambulatory Visit: Payer: Self-pay

## 2018-07-09 ENCOUNTER — Ambulatory Visit (INDEPENDENT_AMBULATORY_CARE_PROVIDER_SITE_OTHER): Payer: Self-pay | Admitting: General Practice

## 2018-07-09 DIAGNOSIS — Z7901 Long term (current) use of anticoagulants: Secondary | ICD-10-CM

## 2018-07-09 DIAGNOSIS — I82442 Acute embolism and thrombosis of left tibial vein: Secondary | ICD-10-CM

## 2018-07-09 LAB — POCT INR: INR: 1.5 — AB (ref 2.0–3.0)

## 2018-07-09 NOTE — Patient Instructions (Addendum)
Pre visit review using our clinic review tool, if applicable. No additional management support is needed unless otherwise documented below in the visit note.  Take 1 1/2 tablets today and tomorrow and then continue to take 1 pill (5mg ) daily.  Recheck in 3 weeks. Dosing instructions left on patient's VM.

## 2018-07-10 ENCOUNTER — Other Ambulatory Visit: Payer: Self-pay | Admitting: Internal Medicine

## 2018-07-10 ENCOUNTER — Other Ambulatory Visit: Payer: Self-pay

## 2018-07-10 DIAGNOSIS — Z7901 Long term (current) use of anticoagulants: Secondary | ICD-10-CM

## 2018-07-14 ENCOUNTER — Other Ambulatory Visit: Payer: Self-pay | Admitting: Internal Medicine

## 2018-07-22 ENCOUNTER — Telehealth: Payer: Self-pay | Admitting: Internal Medicine

## 2018-07-22 NOTE — Telephone Encounter (Signed)
What is the prostate supplement? Is is [employer]?

## 2018-07-22 NOTE — Telephone Encounter (Signed)
Patient called today to see if he is able to take a prostate supplement while also taking his warfarin & Lisinopril.  Patient requested a call back.    Best C/B # [phone]

## 2018-07-23 NOTE — Telephone Encounter (Signed)
He said right now he does not know the name he is wondering if it would be okay to take some OTC.

## 2018-07-23 NOTE — Telephone Encounter (Signed)
That is the brand name. I need to know the active ingredient. I know Saw Palmetto does not cause any interaction. Maybe it would be best if he looked for one that has that as the main ingredient.

## 2018-07-23 NOTE — Telephone Encounter (Signed)
Because so many things interact with Coumadin, I need to know the active ingredients to make sure there is not an interaction.

## 2018-07-23 NOTE — Telephone Encounter (Signed)
Patient will find out which medication he wants to take then he will call us back with name.

## 2018-07-23 NOTE — Telephone Encounter (Signed)
He said he found some names at [facility]. Beta prostate  superbeta prostate urino zinc proflow Prostate formula  Prostate defense  Prostate complete

## 2018-07-24 NOTE — Telephone Encounter (Signed)
Pt notified of Webb Silversmith, NP's comments, he will look for med with [facility]

## 2018-07-31 ENCOUNTER — Other Ambulatory Visit: Payer: Self-pay

## 2018-07-31 ENCOUNTER — Ambulatory Visit (INDEPENDENT_AMBULATORY_CARE_PROVIDER_SITE_OTHER): Payer: Self-pay | Admitting: General Practice

## 2018-07-31 DIAGNOSIS — Z7901 Long term (current) use of anticoagulants: Secondary | ICD-10-CM

## 2018-07-31 LAB — POCT INR: INR: 2.7 (ref 2.0–3.0)

## 2018-07-31 NOTE — Patient Instructions (Addendum)
Pre visit review using our clinic review tool, if applicable. No additional management support is needed unless otherwise documented below in the visit note.  Continue to take 1 pill (5mg) daily.  Recheck in 4 weeks.  

## 2018-08-01 NOTE — Telephone Encounter (Signed)
Spoke with patient, aware that per Rollene Fare it is not recommended to take this supplement as it can interfere and cause bleeding issues with his Coumadin. Pt expressed understanding. Pt did not want medicine box back, this has been thrown in trash. Nothing further needed.

## 2018-09-04 ENCOUNTER — Ambulatory Visit (INDEPENDENT_AMBULATORY_CARE_PROVIDER_SITE_OTHER): Payer: Self-pay | Admitting: General Practice

## 2018-09-04 DIAGNOSIS — Z7901 Long term (current) use of anticoagulants: Secondary | ICD-10-CM

## 2018-09-04 LAB — POCT INR: INR: 3.1 — AB (ref 2.0–3.0)

## 2018-09-04 NOTE — Patient Instructions (Addendum)
Pre visit review using our clinic review tool, if applicable. No additional management support is needed unless otherwise documented below in the visit note.  Skip coumadin today and then continue to take 1 pill (5mg ) daily.  Recheck in 4 weeks.

## 2018-09-09 ENCOUNTER — Encounter: Payer: Self-pay | Admitting: Internal Medicine

## 2018-09-26 ENCOUNTER — Telehealth: Payer: Self-pay | Admitting: *Deleted

## 2018-09-26 NOTE — Telephone Encounter (Signed)
Left message for patient to notify them that it is time to schedule annual low dose lung cancer screening CT scan. Instructed patient to call back to verify information prior to the scan being scheduled.  

## 2018-09-30 ENCOUNTER — Telehealth: Payer: Self-pay | Admitting: *Deleted

## 2018-09-30 DIAGNOSIS — Z87891 Personal history of nicotine dependence: Secondary | ICD-10-CM

## 2018-09-30 DIAGNOSIS — Z122 Encounter for screening for malignant neoplasm of respiratory organs: Secondary | ICD-10-CM

## 2018-09-30 NOTE — Telephone Encounter (Signed)
Patient has been notified that annual lung cancer screening low dose CT scan is due currently or will be in near future. Confirmed that patient is within the age range of 55-77, and asymptomatic, (no signs or symptoms of lung cancer). Patient denies illness that would prevent curative treatment for lung cancer if found. Verified smoking history, (current, 34.5 pack year). The shared decision making visit was done 09/19/16. Patient is agreeable for CT scan being scheduled.

## 2018-10-02 ENCOUNTER — Other Ambulatory Visit: Payer: Self-pay

## 2018-10-02 ENCOUNTER — Ambulatory Visit (INDEPENDENT_AMBULATORY_CARE_PROVIDER_SITE_OTHER): Payer: Self-pay | Admitting: General Practice

## 2018-10-02 DIAGNOSIS — Z7901 Long term (current) use of anticoagulants: Secondary | ICD-10-CM

## 2018-10-02 LAB — POCT INR: INR: 2.1 (ref 2.0–3.0)

## 2018-10-02 NOTE — Patient Instructions (Addendum)
Pre visit review using our clinic review tool, if applicable. No additional management support is needed unless otherwise documented below in the visit note.  Continue to take 1 pill (5mg) daily.  Recheck in 4 weeks.  

## 2018-10-08 ENCOUNTER — Ambulatory Visit
Admission: RE | Admit: 2018-10-08 | Discharge: 2018-10-08 | Disposition: A | Discharge status: Home / Self Care | Payer: Self-pay | Source: Ambulatory Visit | Attending: Oncology | Admitting: Oncology

## 2018-10-08 ENCOUNTER — Other Ambulatory Visit: Payer: Self-pay

## 2018-10-08 DIAGNOSIS — Z87891 Personal history of nicotine dependence: Secondary | ICD-10-CM | POA: Insufficient documentation

## 2018-10-08 DIAGNOSIS — Z122 Encounter for screening for malignant neoplasm of respiratory organs: Secondary | ICD-10-CM | POA: Insufficient documentation

## 2018-10-11 ENCOUNTER — Other Ambulatory Visit: Payer: Self-pay | Admitting: Internal Medicine

## 2018-10-15 ENCOUNTER — Encounter: Payer: Self-pay | Admitting: *Deleted

## 2018-10-20 ENCOUNTER — Encounter: Payer: Self-pay | Admitting: Internal Medicine

## 2018-10-20 ENCOUNTER — Ambulatory Visit (INDEPENDENT_AMBULATORY_CARE_PROVIDER_SITE_OTHER): Payer: Self-pay | Admitting: Internal Medicine

## 2018-10-20 ENCOUNTER — Other Ambulatory Visit: Payer: Self-pay

## 2018-10-20 ENCOUNTER — Telehealth: Payer: Self-pay

## 2018-10-20 VITALS — BP 130/82 | HR 102 | Temp 98.5°F | Ht 71.0 in | Wt 203.0 lb

## 2018-10-20 DIAGNOSIS — Z125 Encounter for screening for malignant neoplasm of prostate: Secondary | ICD-10-CM

## 2018-10-20 DIAGNOSIS — D582 Other hemoglobinopathies: Secondary | ICD-10-CM

## 2018-10-20 DIAGNOSIS — J449 Chronic obstructive pulmonary disease, unspecified: Secondary | ICD-10-CM | POA: Insufficient documentation

## 2018-10-20 DIAGNOSIS — I10 Essential (primary) hypertension: Secondary | ICD-10-CM

## 2018-10-20 DIAGNOSIS — J439 Emphysema, unspecified: Secondary | ICD-10-CM

## 2018-10-20 DIAGNOSIS — I824Y9 Acute embolism and thrombosis of unspecified deep veins of unspecified proximal lower extremity: Secondary | ICD-10-CM

## 2018-10-20 DIAGNOSIS — E7800 Pure hypercholesterolemia, unspecified: Secondary | ICD-10-CM

## 2018-10-20 DIAGNOSIS — I82409 Acute embolism and thrombosis of unspecified deep veins of unspecified lower extremity: Secondary | ICD-10-CM | POA: Insufficient documentation

## 2018-10-20 DIAGNOSIS — Z0000 Encounter for general adult medical examination without abnormal findings: Secondary | ICD-10-CM

## 2018-10-20 LAB — COMPREHENSIVE METABOLIC PANEL
ALT: 26 U/L (ref 0–53)
AST: 17 U/L (ref 0–37)
Albumin: 4.3 g/dL (ref 3.5–5.2)
Alkaline Phosphatase: 68 U/L (ref 39–117)
BUN: 15 mg/dL (ref 6–23)
CO2: 32 mEq/L (ref 19–32)
Calcium: 10.5 mg/dL (ref 8.4–10.5)
Chloride: 100 mEq/L (ref 96–112)
Creatinine, Ser: 0.99 mg/dL (ref 0.40–1.50)
GFR: 76.18 mL/min (ref >60.00)
Glucose, Bld: 112 mg/dL — ABNORMAL HIGH (ref 70–99)
Potassium: 5.1 mEq/L (ref 3.5–5.1)
Sodium: 138 mEq/L (ref 135–145)
Total Bilirubin: 0.4 mg/dL (ref 0.2–1.2)
Total Protein: 6.9 g/dL (ref 6.0–8.3)

## 2018-10-20 LAB — LIPID PANEL
Cholesterol: 212 mg/dL — ABNORMAL HIGH (ref 0–200)
HDL: 41.9 mg/dL (ref >39.00)
LDL Cholesterol: 137 mg/dL — ABNORMAL HIGH (ref 0–99)
NonHDL: 170.36
Total CHOL/HDL Ratio: 5
Triglycerides: 165 mg/dL — ABNORMAL HIGH (ref 0.0–149.0)
VLDL: 33 mg/dL (ref 0.0–40.0)

## 2018-10-20 LAB — CBC
HCT: 53.6 % — ABNORMAL HIGH (ref 39.0–52.0)
Hemoglobin: 18.1 g/dL (ref 13.0–17.0)
MCHC: 33.8 g/dL (ref 30.0–36.0)
MCV: 99 fl (ref 78.0–100.0)
Platelets: 212 10*3/uL (ref 150.0–400.0)
RBC: 5.41 Mil/uL (ref 4.22–5.81)
RDW: 14 % (ref 11.5–15.5)
WBC: 6.8 10*3/uL (ref 4.0–10.5)

## 2018-10-20 LAB — HEMOGLOBIN A1C: Hgb A1c MFr Bld: 5.9 % (ref 4.6–6.5)

## 2018-10-20 LAB — PSA: PSA: 2.68 ng/mL (ref 0.10–4.00)

## 2018-10-20 NOTE — Assessment & Plan Note (Addendum)
Unprovoked On lifelong Eliquis (could not afford Eliquis, or Xarelto) No longer following with hematology

## 2018-10-20 NOTE — Telephone Encounter (Signed)
[facility] called to report a critical result @1500     Hemoglobin 18.1

## 2018-10-20 NOTE — Assessment & Plan Note (Signed)
CMET and lipid profile today He is not on cholesterol lowering medication at this time Will monitor

## 2018-10-20 NOTE — Assessment & Plan Note (Signed)
Continues to smoke Declines smoking cessation at this time Continue yearly lung cancer screening

## 2018-10-20 NOTE — Progress Notes (Addendum)
Subjective:    Patient ID: Walter Maddox, male    DOB: 30-Apr-1955, 63 y.o.   MRN: [id number]  HPI  Pt presents to the clinic today for his annual exam. He is also due to follow up chronic conditions.  HTN: His BP today is 130/82. He is taking Lisinopril HCT as prescribed. ECG from 08/2016 reviewed.  HLD: His last LDL was 118, 08/2016. He is not taking any cholesterol lowering medication at this time. He tries to consume a low fat diet.  Hx of Unprovoked DVT: On lifelong Eliquis. He denies s/s of bleeding. He gets his INR checked monthly.  COPD: He denies chronic cough or shortness of breath. He does continue to smoke. He is getting yearly lung cancer screening. He is not using any inhalers.  Flu: never Tetanus: never Zostovax: never Shingrix: never PSA Screening: never Colon Screening: 10/2005 Vision Screening: as needed Dentist: as needed  Diet: He does eat meat. He consumes some fruits and vegetables and rarely fried foods. Drinks water,sweet tea, and soda Exercise: None  Review of Systems      Past Medical History:  Diagnosis Date  . Back pain   . Chronic pain of right hip   . Dupuytren's contracture of both hands   . DVT of lower limb, acute (HCC) 08/17/2016   per pt left leg  . Elbow pain, chronic   . Hyperlipidemia   . Hypertension   . Severe flexion contractures of all joints 2009   bilateral hands some finger immobility per pt    Current Outpatient Medications  Medication Sig Dispense Refill  . lisinopril-hydrochlorothiazide (ZESTORETIC) 10-12.5 MG tablet TAKE 1 TABLET BY MOUTH DAILY 90 tablet 0  . warfarin (COUMADIN) 5 MG tablet TAKE 1 TABLET(5 MG) BY MOUTH DAILY AS DIRECTED BY COUMADIN CLINIC 35 tablet 3   No current facility-administered medications for this visit.     No Known Allergies  Family History  Problem Relation Age of Onset  . Heart disease Mother   . Parkinson's disease Mother   . Dementia Mother   . Pulmonary embolism Mother   .  Diabetes Father   . Hypercholesterolemia Sister   . Hypercholesterolemia Sister   . Breast cancer Sister     Social History   Socioeconomic History  . Marital status: Single    Spouse name: Not on file  . Number of children: Not on file  . Years of education: Not on file  . Highest education level: Not on file  Occupational History  . Not on file  Social Needs  . Financial resource strain: Not on file  . Food insecurity    Worry: Not on file    Inability: Not on file  . Transportation needs    Medical: Not on file    Non-medical: Not on file  Tobacco Use  . Smoking status: Current Every Day Smoker    Packs/day: 0.50    Years: 34.00    Pack years: 17.00    Types: Cigarettes  . Smokeless tobacco: Never Used  . Tobacco comment: pt refd smoking cessation info  Substance and Sexual Activity  . Alcohol use: Yes    Comment: beer socially  . Drug use: No  . Sexual activity: Never  Lifestyle  . Physical activity    Days per week: Not on file    Minutes per session: Not on file  . Stress: Not on file  Relationships  . Social Musicianconnections    Talks on phone: Not  on file    Gets together: Not on file    Attends religious service: Not on file    Active member of club or organization: Not on file    Attends meetings of clubs or organizations: Not on file    Relationship status: Not on file  . Intimate partner violence    Fear of current or ex partner: Not on file    Emotionally abused: Not on file    Physically abused: Not on file    Forced sexual activity: Not on file  Other Topics Concern  . Not on file  Social History Narrative  . Not on file     Constitutional: Denies fever, malaise, fatigue, headache or abrupt weight changes.  HEENT: Pt reports ringing in his ears, history of wax buildup.. Denies eye pain, eye redness, ear pain, runny nose, nasal congestion, bloody nose, or sore throat. Respiratory: Denies difficulty breathing, shortness of breath, cough or sputum  production.   Cardiovascular: Denies chest pain, chest tightness, palpitations or swelling in the hands or feet.  Gastrointestinal: Denies abdominal pain, bloating, constipation, diarrhea or blood in the stool.  GU: Denies urgency, frequency, pain with urination, burning sensation, blood in urine, odor or discharge. Musculoskeletal: Pt reports intermittent low back pain. Denies decrease in range of motion, difficulty with gait, or joint swelling.  Skin: Denies redness, rashes, lesions or ulcercations.  Neurological: Denies dizziness, difficulty with memory, difficulty with speech or problems with balance and coordination.  Psych: Denies anxiety, depression, SI/HI.  No other specific complaints in a complete review of systems (except as listed in HPI above).  Objective:   Physical Exam  BP 130/82   Pulse (!) 102   Temp 98.5 F (36.9 C) (Temporal)   Ht 5\' 11"  (1.803 m)   Wt 203 lb (92.1 kg)   SpO2 100%   BMI 28.31 kg/m  Wt Readings from Last 3 Encounters:  10/20/18 203 lb (92.1 kg)  10/08/18 210 lb (95.3 kg)  09/23/17 210 lb (95.3 kg)    General: Appears his stated age, well developed, well nourished in NAD. Skin: Warm, dry and intact. No rashes noted. HEENT: Head: normal shape and size; Eyes: sclera white, no icterus, conjunctiva pink, PERRLA and EOMs intact; Ears: Tm's gray and intact, normal light reflex, cerumen impaction on the right;  Neck:  Neck supple, trachea midline. No masses, lumps or thyromegaly present.  Cardiovascular: Normal rate and rhythm. S1,S2 noted.  No murmur, rubs or gallops noted. No JVD or BLE edema. No carotid bruits noted. Pulmonary/Chest: Normal effort and positive vesicular breath sounds. No respiratory distress. No wheezes, rales or ronchi noted.  Abdomen: Soft and nontender. Normal bowel sounds. No distention or masses noted. Liver, spleen and kidneys non palpable. Musculoskeletal: Strength 5/5 BUE/BLE. No difficulty with gait.  Neurological: Alert  and oriented. Cranial nerves II-XII grossly intact. Coordination normal.  Psychiatric: Mood and affect normal. Behavior is normal. Judgment and thought content normal.   BMET    Component Value Date/Time   NA 134 (L) 07/09/2017 0840   K 4.6 07/09/2017 0840   CL 97 07/09/2017 0840   CO2 29 07/09/2017 0840   GLUCOSE 113 (H) 07/09/2017 0840   BUN 9 07/09/2017 0840   CREATININE 0.96 07/09/2017 0840   CREATININE 1.00 08/31/2016 1513   CALCIUM 9.9 07/09/2017 0840    Lipid Panel     Component Value Date/Time   CHOL 180 08/16/2016 1448   TRIG 98.0 08/16/2016 1448   HDL 41.60 08/16/2016  1448   CHOLHDL 4 08/16/2016 1448   VLDL 19.6 08/16/2016 1448   LDLCALC 119 (H) 08/16/2016 1448    CBC    Component Value Date/Time   WBC 10.6 09/03/2016 1542   RBC 5.17 09/03/2016 1542   HGB 17.2 09/03/2016 1542   HCT 48.2 09/03/2016 1542   PLT 322 09/03/2016 1542   MCV 93.2 09/03/2016 1542   MCH 33.2 09/03/2016 1542   MCHC 35.6 09/03/2016 1542   RDW 12.4 09/03/2016 1542   LYMPHSABS 2.2 09/03/2016 1542   MONOABS 1.0 09/03/2016 1542   EOSABS 0.1 09/03/2016 1542   BASOSABS 0.1 09/03/2016 1542    Hgb A1C Lab Results  Component Value Date   HGBA1C 5.6 08/16/2016            Assessment & Plan:   Preventative Health Maintenance:  He declines flu shot He declines tetanus  He declines Shingrix He will get a colonoscopy once he gets Medicare Encouraged him to consume a balanced diet and exercise regimen Advised him to see an eye doctor and dentist annually Will check CBC, CMET, Lipid, A1C and PSA today  RTC in 1 year, sooner if needed Nicki Reaper, NP

## 2018-10-20 NOTE — Patient Instructions (Signed)

## 2018-10-20 NOTE — Assessment & Plan Note (Addendum)
CMET today Reinforced DASH diet and exercise for weight for weight loss Continue Lisinopril HCT

## 2018-10-31 NOTE — Addendum Note (Signed)
Addended by: Lurlean Nanny on: 10/31/2018 11:00 AM   Modules accepted: Orders

## 2018-11-06 ENCOUNTER — Ambulatory Visit (INDEPENDENT_AMBULATORY_CARE_PROVIDER_SITE_OTHER): Payer: Self-pay | Admitting: General Practice

## 2018-11-06 ENCOUNTER — Other Ambulatory Visit: Payer: Self-pay

## 2018-11-06 DIAGNOSIS — Z7901 Long term (current) use of anticoagulants: Secondary | ICD-10-CM

## 2018-11-06 LAB — POCT INR: INR: 2.8 (ref 2.0–3.0)

## 2018-11-06 NOTE — Patient Instructions (Addendum)
Pre visit review using our clinic review tool, if applicable. No additional management support is needed unless otherwise documented below in the visit note.  Continue to take 1 pill (5mg) daily.  Recheck in 6 weeks 

## 2018-11-28 ENCOUNTER — Other Ambulatory Visit: Payer: Self-pay | Admitting: Internal Medicine

## 2018-11-28 DIAGNOSIS — Z7901 Long term (current) use of anticoagulants: Secondary | ICD-10-CM

## 2018-12-18 ENCOUNTER — Ambulatory Visit (INDEPENDENT_AMBULATORY_CARE_PROVIDER_SITE_OTHER): Payer: Self-pay | Admitting: General Practice

## 2018-12-18 ENCOUNTER — Other Ambulatory Visit: Payer: Self-pay

## 2018-12-18 DIAGNOSIS — Z7901 Long term (current) use of anticoagulants: Secondary | ICD-10-CM

## 2018-12-18 DIAGNOSIS — I82402 Acute embolism and thrombosis of unspecified deep veins of left lower extremity: Secondary | ICD-10-CM

## 2018-12-18 LAB — POCT INR: INR: 1.7 — AB (ref 2.0–3.0)

## 2018-12-18 NOTE — Patient Instructions (Signed)
Pre visit review using our clinic review tool, if applicable. No additional management support is needed unless otherwise documented below in the visit note.  Take 1 1/2 tablets today (12/3) and then continue to take 1 pill (5mg ) daily.  Recheck in 4 weeks.

## 2019-01-03 ENCOUNTER — Other Ambulatory Visit: Payer: Self-pay | Admitting: Internal Medicine

## 2019-01-22 ENCOUNTER — Other Ambulatory Visit: Payer: Self-pay

## 2019-01-22 ENCOUNTER — Ambulatory Visit (INDEPENDENT_AMBULATORY_CARE_PROVIDER_SITE_OTHER): Payer: Self-pay | Admitting: General Practice

## 2019-01-22 DIAGNOSIS — Z7901 Long term (current) use of anticoagulants: Secondary | ICD-10-CM

## 2019-01-22 LAB — POCT INR: INR: 2.5 (ref 2.0–3.0)

## 2019-01-22 NOTE — Patient Instructions (Signed)
Pre visit review using our clinic review tool, if applicable. No additional management support is needed unless otherwise documented below in the visit note.  Continue to take 1 pill (5mg) daily.  Recheck in 4 weeks.  

## 2019-02-03 ENCOUNTER — Telehealth: Payer: Self-pay

## 2019-02-03 NOTE — Telephone Encounter (Signed)
If he can not donate blood, I'm not sure what his other options are. I can refer him to hematology for further evaluation if he would like. If he smokes, would recommend that he stop as that can cause elevated RBC's.

## 2019-02-03 NOTE — Telephone Encounter (Signed)
Patient states that he was told that due to his rbccount being elevated, he would benefit by donating blood. Patient states that due to him being on a blood thinner, they will not allow him to donate blood, and he is asking what other options Rene Kocher has for him to lower his rbc count?

## 2019-02-04 NOTE — Telephone Encounter (Signed)
I spoke to pt and he states he was seeing Oncology in the past... pt would like to know if there is a way for you to get more information on if there is anything they can or will do for him before him scheduling as appt as he does not want to waste his time... pt reports he is trying his best to cut back on smoking but is definitely not looking to quit... please advise

## 2019-02-05 NOTE — Telephone Encounter (Signed)
I looked back, he did not have this polycythemia when he saw hematology in 2018 for his DVT workup. I would have to place a new referral for them to address this issue. Is he okay with referral?

## 2019-02-06 ENCOUNTER — Other Ambulatory Visit: Payer: Self-pay

## 2019-02-06 NOTE — Telephone Encounter (Signed)
They will likely do additional lab work and talk to him.

## 2019-02-06 NOTE — Telephone Encounter (Signed)
Pt is wanting to know what would they do before he goes. I explained that we are not able to tell him what their recommendations are going to be and pt stated he wanted to know if we could find out before agreeing to the referral

## 2019-02-17 ENCOUNTER — Telehealth: Payer: Self-pay

## 2019-02-17 NOTE — Telephone Encounter (Signed)
Pt has apt for coumadin clinic on 2/4. Need to screen pt for covid. LVM

## 2019-02-19 ENCOUNTER — Ambulatory Visit: Payer: Self-pay

## 2019-03-05 ENCOUNTER — Ambulatory Visit: Payer: Self-pay

## 2019-03-12 ENCOUNTER — Other Ambulatory Visit: Payer: Self-pay

## 2019-03-12 ENCOUNTER — Ambulatory Visit (INDEPENDENT_AMBULATORY_CARE_PROVIDER_SITE_OTHER): Payer: Self-pay

## 2019-03-12 DIAGNOSIS — Z7901 Long term (current) use of anticoagulants: Secondary | ICD-10-CM

## 2019-03-12 LAB — POCT INR: INR: 1.6 — AB (ref 2.0–3.0)

## 2019-03-12 NOTE — Patient Instructions (Addendum)
Pre visit review using our clinic review tool, if applicable. No additional management support is needed unless otherwise documented below in the visit note.  Increase dose today and tomorrow to 7.5mg  then continue to take 1 pill (5mg ) daily.  Recheck in 5 weeks due to pt being out of town for 2 wks.

## 2019-04-16 ENCOUNTER — Ambulatory Visit (INDEPENDENT_AMBULATORY_CARE_PROVIDER_SITE_OTHER): Payer: Self-pay

## 2019-04-16 ENCOUNTER — Other Ambulatory Visit: Payer: Self-pay

## 2019-04-16 DIAGNOSIS — Z7901 Long term (current) use of anticoagulants: Secondary | ICD-10-CM

## 2019-04-16 LAB — POCT INR: INR: 2 (ref 2.0–3.0)

## 2019-04-16 MED ORDER — WARFARIN SODIUM 5 MG PO TABS: ORAL_TABLET | ORAL | 3 refills | Status: DC

## 2019-04-16 NOTE — Patient Instructions (Addendum)
Pre visit review using our clinic review tool, if applicable. No additional management support is needed unless otherwise documented below in the visit note.  Continue to take 1 pill (5mg) daily.  Recheck in 5 weeks 

## 2019-05-21 ENCOUNTER — Ambulatory Visit (INDEPENDENT_AMBULATORY_CARE_PROVIDER_SITE_OTHER): Payer: Self-pay

## 2019-05-21 ENCOUNTER — Other Ambulatory Visit: Payer: Self-pay

## 2019-05-21 DIAGNOSIS — Z7901 Long term (current) use of anticoagulants: Secondary | ICD-10-CM

## 2019-05-21 LAB — POCT INR: INR: 1.7 — AB (ref 2.0–3.0)

## 2019-05-21 NOTE — Patient Instructions (Addendum)
Pre visit review using our clinic review tool, if applicable. No additional management support is needed unless otherwise documented below in the visit note.  Increase dose today to 7.5mg  then continue to take 1 pill (5mg ) daily.  Recheck in 4 weeks.

## 2019-06-18 ENCOUNTER — Ambulatory Visit (INDEPENDENT_AMBULATORY_CARE_PROVIDER_SITE_OTHER): Payer: Self-pay

## 2019-06-18 ENCOUNTER — Other Ambulatory Visit: Payer: Self-pay

## 2019-06-18 DIAGNOSIS — Z7901 Long term (current) use of anticoagulants: Secondary | ICD-10-CM

## 2019-06-18 LAB — POCT INR: INR: 1.5 — AB (ref 2.0–3.0)

## 2019-06-18 NOTE — Patient Instructions (Addendum)
Pre visit review using our clinic review tool, if applicable. No additional management support is needed unless otherwise documented below in the visit note.  Increase dose today to 10mg  then change weekly dosing to take 1 pill (5mg ) daily except take 7.5mg  on Mon and Thurs.  Recheck in 3 weeks.

## 2019-06-30 ENCOUNTER — Other Ambulatory Visit: Payer: Self-pay | Admitting: Internal Medicine

## 2019-07-09 ENCOUNTER — Other Ambulatory Visit: Payer: Self-pay

## 2019-07-09 ENCOUNTER — Ambulatory Visit (INDEPENDENT_AMBULATORY_CARE_PROVIDER_SITE_OTHER): Payer: Self-pay

## 2019-07-09 DIAGNOSIS — Z7901 Long term (current) use of anticoagulants: Secondary | ICD-10-CM

## 2019-07-09 LAB — POCT INR: INR: 2.2 (ref 2.0–3.0)

## 2019-07-09 NOTE — Patient Instructions (Addendum)
Pre visit review using our clinic review tool, if applicable. No additional management support is needed unless otherwise documented below in the visit note.  Continue to take 1 pill (5mg ) daily except take 7.5mg  on Mon and Thurs.  Recheck in 5 weeks.

## 2019-08-13 ENCOUNTER — Other Ambulatory Visit: Payer: Self-pay

## 2019-08-13 ENCOUNTER — Ambulatory Visit (INDEPENDENT_AMBULATORY_CARE_PROVIDER_SITE_OTHER): Payer: Self-pay

## 2019-08-13 DIAGNOSIS — Z7901 Long term (current) use of anticoagulants: Secondary | ICD-10-CM

## 2019-08-13 LAB — POCT INR: INR: 1.9 — AB (ref 2.0–3.0)

## 2019-08-13 NOTE — Patient Instructions (Addendum)
Pre visit review using our clinic review tool, if applicable. No additional management support is needed unless otherwise documented below in the visit note.  Increase dose today to 10mg  then continue to take 1 pill (5mg ) daily except take 7.5mg  on Mon and Thurs.  Recheck in 5 weeks.

## 2019-08-14 ENCOUNTER — Other Ambulatory Visit: Payer: Self-pay | Admitting: Internal Medicine

## 2019-08-14 DIAGNOSIS — Z7901 Long term (current) use of anticoagulants: Secondary | ICD-10-CM

## 2019-08-14 NOTE — Telephone Encounter (Signed)
Pt compliant with coumadin management. Sent in script 

## 2019-09-12 ENCOUNTER — Telehealth: Payer: Self-pay | Admitting: *Deleted

## 2019-09-12 NOTE — Telephone Encounter (Signed)
Pt notified that lung cancer screening imaging is due currently or in the near future. Patient smokes one pack of cigarettes per day.  Appointment made for 10/15/2019 @ 8:45 am.

## 2019-09-16 ENCOUNTER — Other Ambulatory Visit: Payer: Self-pay | Admitting: Internal Medicine

## 2019-09-17 ENCOUNTER — Ambulatory Visit (INDEPENDENT_AMBULATORY_CARE_PROVIDER_SITE_OTHER): Payer: Self-pay

## 2019-09-17 ENCOUNTER — Other Ambulatory Visit: Payer: Self-pay

## 2019-09-17 DIAGNOSIS — Z7901 Long term (current) use of anticoagulants: Secondary | ICD-10-CM

## 2019-09-17 LAB — POCT INR: INR: 1.8 — AB (ref 2.0–3.0)

## 2019-09-17 NOTE — Patient Instructions (Addendum)
Pre visit review using our clinic review tool, if applicable. No additional management support is needed unless otherwise documented below in the visit note.  Increase dose today to 10mg  then continue to take 1 pill (5mg ) daily except take 7.5mg  on Mon, Wed, and Fri.  Recheck in 4 weeks.

## 2019-09-22 ENCOUNTER — Other Ambulatory Visit: Payer: Self-pay | Admitting: *Deleted

## 2019-09-22 DIAGNOSIS — Z122 Encounter for screening for malignant neoplasm of respiratory organs: Secondary | ICD-10-CM

## 2019-09-22 DIAGNOSIS — Z87891 Personal history of nicotine dependence: Secondary | ICD-10-CM

## 2019-09-23 ENCOUNTER — Other Ambulatory Visit: Payer: Self-pay | Admitting: Internal Medicine

## 2019-09-24 ENCOUNTER — Other Ambulatory Visit: Payer: Self-pay | Admitting: Internal Medicine

## 2019-10-15 ENCOUNTER — Ambulatory Visit (INDEPENDENT_AMBULATORY_CARE_PROVIDER_SITE_OTHER): Payer: Self-pay

## 2019-10-15 ENCOUNTER — Ambulatory Visit
Admission: RE | Admit: 2019-10-15 | Discharge: 2019-10-15 | Disposition: A | Discharge status: Home / Self Care | Payer: Self-pay | Source: Ambulatory Visit | Attending: Oncology | Admitting: Oncology

## 2019-10-15 ENCOUNTER — Telehealth: Payer: Self-pay

## 2019-10-15 ENCOUNTER — Other Ambulatory Visit: Payer: Self-pay

## 2019-10-15 DIAGNOSIS — Z122 Encounter for screening for malignant neoplasm of respiratory organs: Secondary | ICD-10-CM | POA: Insufficient documentation

## 2019-10-15 DIAGNOSIS — Z87891 Personal history of nicotine dependence: Secondary | ICD-10-CM | POA: Insufficient documentation

## 2019-10-15 DIAGNOSIS — Z7901 Long term (current) use of anticoagulants: Secondary | ICD-10-CM

## 2019-10-15 LAB — POCT INR: INR: 3.3 — AB (ref 2.0–3.0)

## 2019-10-15 NOTE — Patient Instructions (Addendum)
Pre visit review using our clinic review tool, if applicable. No additional management support is needed unless otherwise documented below in the visit note.  Decrease dose today to 2.5mg  then continue to take 1 pill (5mg ) daily except take 7.5mg  on Mon, Wed, and Fri.  Recheck in  weeks.

## 2019-10-15 NOTE — Telephone Encounter (Signed)
Pt was in today for coumadin clinic. There was a note on his apt note that said he needed apt with PCP. Pt is due for annual physical.  Advised pt of this and he said he talked to [place] about this last year that he would be getting Medicare in January and he would like to wait until then and she said that would be fine. Pt currently does not have insurance. Advised him a msg would be sent so her CMA is aware and if there was any problem this office would contact him. Pt verbalized understanding.

## 2019-10-19 ENCOUNTER — Encounter: Payer: Self-pay | Admitting: *Deleted

## 2019-11-17 ENCOUNTER — Telehealth: Payer: Self-pay | Admitting: Internal Medicine

## 2019-11-17 NOTE — Telephone Encounter (Signed)
Pt needed to reschedule his appointment

## 2019-11-19 ENCOUNTER — Telehealth: Payer: Self-pay

## 2019-11-19 ENCOUNTER — Ambulatory Visit: Payer: Self-pay

## 2019-11-19 NOTE — Telephone Encounter (Signed)
Left voicemail for patient to call the office back to re-schedule his coumadin clinic appointment that was for 0930 today (11/19/19) that patient missed.

## 2019-11-19 NOTE — Telephone Encounter (Signed)
Forwarding to Anisha to contact pt to RS

## 2019-12-01 ENCOUNTER — Other Ambulatory Visit: Payer: Self-pay

## 2019-12-01 ENCOUNTER — Ambulatory Visit (INDEPENDENT_AMBULATORY_CARE_PROVIDER_SITE_OTHER): Payer: Self-pay

## 2019-12-01 DIAGNOSIS — Z7901 Long term (current) use of anticoagulants: Secondary | ICD-10-CM

## 2019-12-01 LAB — POCT INR: INR: 1.9 — AB (ref 2.0–3.0)

## 2019-12-01 NOTE — Patient Instructions (Addendum)
Pre visit review using our clinic review tool, if applicable. No additional management support is needed unless otherwise documented below in the visit note.   Increase dose today to 7.5 mg, then continue to take 1 pill (5mg ) daily except take 7.5mg  on Mon, Wed, and Fri.  Recheck in 4 weeks.

## 2019-12-24 ENCOUNTER — Other Ambulatory Visit: Payer: Self-pay | Admitting: Internal Medicine

## 2019-12-24 DIAGNOSIS — Z7901 Long term (current) use of anticoagulants: Secondary | ICD-10-CM

## 2019-12-25 NOTE — Telephone Encounter (Signed)
Pt compliant with coumadin management. Sent in script 

## 2019-12-31 ENCOUNTER — Ambulatory Visit: Payer: Self-pay

## 2019-12-31 ENCOUNTER — Other Ambulatory Visit: Payer: Self-pay

## 2019-12-31 DIAGNOSIS — Z7901 Long term (current) use of anticoagulants: Secondary | ICD-10-CM

## 2019-12-31 LAB — POCT INR: INR: 2.2 (ref 2.0–3.0)

## 2019-12-31 NOTE — Patient Instructions (Addendum)
Pre visit review using our clinic review tool, if applicable. No additional management support is needed unless otherwise documented below in the visit note.  Continue to take 1 pill (5mg) daily except take 7.5mg on Mon, Wed, and Fri.  Recheck in 4 weeks. 

## 2020-01-21 ENCOUNTER — Other Ambulatory Visit: Payer: Self-pay

## 2020-01-21 ENCOUNTER — Telehealth: Payer: Self-pay | Admitting: Internal Medicine

## 2020-01-21 ENCOUNTER — Ambulatory Visit (INDEPENDENT_AMBULATORY_CARE_PROVIDER_SITE_OTHER): Payer: Medicare Other

## 2020-01-21 ENCOUNTER — Telehealth: Payer: Self-pay

## 2020-01-21 DIAGNOSIS — Z7901 Long term (current) use of anticoagulants: Secondary | ICD-10-CM | POA: Diagnosis not present

## 2020-01-21 LAB — POCT INR: INR: 2.5 (ref 2.0–3.0)

## 2020-01-21 NOTE — Telephone Encounter (Signed)
Talked to [place] at [facility] who reports pt was getting warfarin at [facility] and getting manufacturer Teva warfarin. [facility] carries manufacturer Taro warfarin. They do not package the warfarin in individual pill packs. They are packaged in bottles and will be in a 90 day supply. Gave verbal order that this was ok. Robynn Pane verbalized understanding.   Contacted pt and advised to change manufacturers we will need him to come in sooner to have INR checked. Pt agreed so apt was RS for 2/10. Advised pt if any abnormal signs of bleeding or any other changes to contact office. Pt verbalized understanding.   Rs pts apt.

## 2020-01-21 NOTE — Telephone Encounter (Signed)
[facility] Night - Client TELEPHONE ADVICE RECORD Access[employer]urse Patient Name: [name] Gender: Male DOB: 1955-04-03 Age: 65 Y 11 M 19 D Return Phone Number: [phone] (Primary) Address: [place] [address] Client [facility] Night - Client Client Site [place] [facility] [place] - Night Physician Nicki Reaper - NP Contact Type Call Who Is Calling Pharmacy Call Type Pharmacy Send to RN Chief Complaint Prescription Refill or Medication Request (non symptomatic) Reason for Call Request to change medication order Initial Comment caller states she is from [facility] Pharmacy Name [facility] Pharmacist Name nicole Pharmacy Number [phone] Translation No Nurse Assessment Nurse: Coolidge Breeze Date/Time Lamount Cohen Time): 01/20/2020 7:47:39 PM Please select the assessment type ---Pharmacy clarification Additional Documentation ---Need to notify the physician of new NDC number and need the physician to give authorization. Ref [id number] On pt's Warfarin 5mg . Is there an on-call physician for the client? ---Yes Do the client directives allow paging the on call for medication concerns? ---No Additional Documentation ---mail order pharmacy to contact physician office during business hours tomorrow. Not an emergency med refill per pharmacist. Disp. Time Time) Disposition Final User 01/20/2020 7:53:34 PM Clinical Call Yes 03/19/2020

## 2020-01-21 NOTE — Patient Instructions (Addendum)
Pre visit review using our clinic review tool, if applicable. No additional management support is needed unless otherwise documented below in the visit note.  Continue to take 1 pill (5mg) daily except take 7.5mg on Mon, Wed, and Fri.  Recheck in 6 weeks. 

## 2020-01-21 NOTE — Telephone Encounter (Signed)
Pharmacy is calling in wanting to get approval to change manufacture of the medicine. Medicine: Warfarin

## 2020-01-23 NOTE — Telephone Encounter (Signed)
Me     01/21/20 12:09 PM Note Talked to Walter Maddox at [facility] who reports pt was getting warfarin at [facility] and getting manufacturer Teva warfarin. [facility] carries manufacturer Taro warfarin. They do not package the warfarin in individual pill packs. They are packaged in bottles and will be in a 90 day supply. Gave verbal order that this was ok. Walter Maddox verbalized understanding.   Contacted pt and advised to change manufacturers we will need him to come in sooner to have INR checked. Pt agreed so apt was RS for 2/10. Advised pt if any abnormal signs of bleeding or any other changes to contact office. Pt verbalized understanding.   Rs pts apt.

## 2020-01-25 ENCOUNTER — Telehealth: Payer: Self-pay

## 2020-01-25 MED ORDER — LISINOPRIL-HYDROCHLOROTHIAZIDE 10-12.5 MG PO TABS: 1.0000 | ORAL_TABLET | Freq: Every day | ORAL | 0 refills | Status: DC

## 2020-01-25 NOTE — Telephone Encounter (Signed)
Patient called in stating medication is supposed to be going to [facility]. Please advise and change.

## 2020-01-26 MED ORDER — LISINOPRIL-HYDROCHLOROTHIAZIDE 10-12.5 MG PO TABS: 1.0000 | ORAL_TABLET | Freq: Every day | ORAL | 0 refills | Status: DC

## 2020-01-26 NOTE — Addendum Note (Signed)
Addended by: Roena Malady on: 01/26/2020 04:37 PM   Modules accepted: Orders

## 2020-01-26 NOTE — Telephone Encounter (Signed)
Rx sent through [facility]  

## 2020-02-23 ENCOUNTER — Other Ambulatory Visit: Payer: Self-pay | Admitting: Internal Medicine

## 2020-02-23 DIAGNOSIS — Z7901 Long term (current) use of anticoagulants: Secondary | ICD-10-CM

## 2020-02-24 NOTE — Telephone Encounter (Signed)
Pharmacy requests refill on: Warfarin 5 mg   LAST REFILL: 12/25/2019 (Q-45, R-3) LAST OV: 01/21/2020 NEXT OV: 02/25/2020 PHARMACY: [facility]

## 2020-02-25 ENCOUNTER — Ambulatory Visit (INDEPENDENT_AMBULATORY_CARE_PROVIDER_SITE_OTHER): Payer: Medicare Other

## 2020-02-25 ENCOUNTER — Other Ambulatory Visit: Payer: Self-pay

## 2020-02-25 DIAGNOSIS — Z7901 Long term (current) use of anticoagulants: Secondary | ICD-10-CM

## 2020-02-25 LAB — POCT INR: INR: 2.5 (ref 2.0–3.0)

## 2020-02-25 NOTE — Patient Instructions (Addendum)
Pre visit review using our clinic review tool, if applicable. No additional management support is needed unless otherwise documented below in the visit note.  Continue to take 1 pill (5mg ) daily except take 7.5mg  on Mon, Wed, and Fri.  Recheck in 6 weeks per pt request

## 2020-03-03 ENCOUNTER — Ambulatory Visit: Payer: Medicare Other

## 2020-03-22 ENCOUNTER — Other Ambulatory Visit: Payer: Self-pay

## 2020-03-22 ENCOUNTER — Encounter: Payer: Self-pay | Admitting: Internal Medicine

## 2020-03-22 ENCOUNTER — Ambulatory Visit (INDEPENDENT_AMBULATORY_CARE_PROVIDER_SITE_OTHER): Payer: Medicare Other | Admitting: Internal Medicine

## 2020-03-22 VITALS — BP 128/80 | HR 94 | Temp 97.4°F | Ht 71.0 in | Wt 194.0 lb

## 2020-03-22 DIAGNOSIS — Z125 Encounter for screening for malignant neoplasm of prostate: Secondary | ICD-10-CM

## 2020-03-22 DIAGNOSIS — Z1159 Encounter for screening for other viral diseases: Secondary | ICD-10-CM

## 2020-03-22 DIAGNOSIS — J439 Emphysema, unspecified: Secondary | ICD-10-CM

## 2020-03-22 DIAGNOSIS — I10 Essential (primary) hypertension: Secondary | ICD-10-CM | POA: Diagnosis not present

## 2020-03-22 DIAGNOSIS — I824Y9 Acute embolism and thrombosis of unspecified deep veins of unspecified proximal lower extremity: Secondary | ICD-10-CM | POA: Diagnosis not present

## 2020-03-22 DIAGNOSIS — Z0000 Encounter for general adult medical examination without abnormal findings: Secondary | ICD-10-CM | POA: Diagnosis not present

## 2020-03-22 DIAGNOSIS — E7800 Pure hypercholesterolemia, unspecified: Secondary | ICD-10-CM

## 2020-03-22 LAB — COMPREHENSIVE METABOLIC PANEL
ALT: 40 U/L (ref 0–53)
AST: 30 U/L (ref 0–37)
Albumin: 4.3 g/dL (ref 3.5–5.2)
Alkaline Phosphatase: 83 U/L (ref 39–117)
BUN: 14 mg/dL (ref 6–23)
CO2: 31 mEq/L (ref 19–32)
Calcium: 10.1 mg/dL (ref 8.4–10.5)
Chloride: 99 mEq/L (ref 96–112)
Creatinine, Ser: 0.91 mg/dL (ref 0.40–1.50)
GFR: 88.68 mL/min (ref >60.00)
Glucose, Bld: 106 mg/dL — ABNORMAL HIGH (ref 70–99)
Potassium: 4.7 mEq/L (ref 3.5–5.1)
Sodium: 138 mEq/L (ref 135–145)
Total Bilirubin: 0.6 mg/dL (ref 0.2–1.2)
Total Protein: 6.8 g/dL (ref 6.0–8.3)

## 2020-03-22 LAB — CBC
HCT: 48.8 % (ref 39.0–52.0)
Hemoglobin: 16.6 g/dL (ref 13.0–17.0)
MCHC: 34 g/dL (ref 30.0–36.0)
MCV: 96.9 fl (ref 78.0–100.0)
Platelets: 213 10*3/uL (ref 150.0–400.0)
RBC: 5.03 Mil/uL (ref 4.22–5.81)
RDW: 13.7 % (ref 11.5–15.5)
WBC: 8.1 10*3/uL (ref 4.0–10.5)

## 2020-03-22 LAB — LIPID PANEL
Cholesterol: 222 mg/dL — ABNORMAL HIGH (ref 0–200)
HDL: 50.3 mg/dL (ref >39.00)
LDL Cholesterol: 134 mg/dL — ABNORMAL HIGH (ref 0–99)
NonHDL: 172.04
Total CHOL/HDL Ratio: 4
Triglycerides: 189 mg/dL — ABNORMAL HIGH (ref 0.0–149.0)
VLDL: 37.8 mg/dL (ref 0.0–40.0)

## 2020-03-22 LAB — PSA, MEDICARE: PSA: 2.37 ng/ml (ref 0.10–4.00)

## 2020-03-22 MED ORDER — LISINOPRIL-HYDROCHLOROTHIAZIDE 10-12.5 MG PO TABS: 1.0000 | ORAL_TABLET | Freq: Every day | ORAL | 3 refills | Status: DC

## 2020-03-22 NOTE — Assessment & Plan Note (Signed)
He is not interested in switching to Eliquis/Xarelto at this time Will continue Coumadin, monthly INR checks CBC today

## 2020-03-22 NOTE — Assessment & Plan Note (Signed)
CMET and Lipid profile today °Encouraged him to consume a low fat diet °

## 2020-03-22 NOTE — Assessment & Plan Note (Signed)
Encouraged smoking cessation- he declines at this time Encouraged yearly low dose lung cancer screening

## 2020-03-22 NOTE — Progress Notes (Addendum)
HPI:  Patient presents to the clinic today for his welcome to Medicare exam.  He is also due to follow-up chronic conditions.  HTN: His BP today is 128/80.  He is taking Lisinopril HCT as prescribed.  ECG from 08/2016 reviewed.  HLD: His last LDL was 137, 10/2018.  He is not taking any cholesterol-lowering medication at this time.  He tries to consume a low-fat diet.  Hx of DVT: Unprovoked, managed on lifelong Coumadin. He gets his INR checked monthly  COPD: He denies chronic cough or shortness of breath.  He does continue to smoke.  He has not used any inhalers.  He has had low-dose lung cancer screening in the past but not in the last few years secondary to lack of insurance.  Past Medical History:  Diagnosis Date  . Back pain   . Chronic pain of right hip   . Dupuytren's contracture of both hands   . DVT of lower limb, acute (HCC) 08/17/2016   per pt left leg  . Elbow pain, chronic   . Hyperlipidemia   . Hypertension   . Severe flexion contractures of all joints 2009   bilateral hands some finger immobility per pt    Current Outpatient Medications  Medication Sig Dispense Refill  . lisinopril-hydrochlorothiazide (ZESTORETIC) 10-12.5 MG tablet Take 1 tablet by mouth daily. 90 tablet 0  . warfarin (COUMADIN) 5 MG tablet TAKE 1 TABLET BY MOUTH  DAILY EXCEPT ON MONDAY,  WEDNESDAY, AND FRIDAY TAKE  1 AND 1/2 TABS DAILY OR AS  DIRECTED BY COUMADIN CLINIC 135 tablet 0   No current facility-administered medications for this visit.    No Known Allergies  Family History  Problem Relation Age of Onset  . Heart disease Mother   . Parkinson's disease Mother   . Dementia Mother   . Pulmonary embolism Mother   . Diabetes Father   . Hypercholesterolemia Sister   . Hypercholesterolemia Sister   . Breast cancer Sister     Social History   Socioeconomic History  . Marital status: Single    Spouse name: Not on file  . Number of children: Not on file  . Years of education: Not on  file  . Highest education level: Not on file  Occupational History  . Not on file  Tobacco Use  . Smoking status: Current Every Day Smoker    Packs/day: 0.50    Years: 34.00    Pack years: 17.00    Types: Cigarettes  . Smokeless tobacco: Never Used  . Tobacco comment: pt refd smoking cessation info  Vaping Use  . Vaping Use: Never used  Substance and Sexual Activity  . Alcohol use: Yes    Comment: beer socially  . Drug use: No  . Sexual activity: Never  Other Topics Concern  . Not on file  Social History Narrative  . Not on file   Social Determinants of Health   Financial Resource Strain: Not on file  Food Insecurity: Not on file  Transportation Needs: Not on file  Physical Activity: Not on file  Stress: Not on file  Social Connections: Not on file  Intimate Partner Violence: Not on file    Hospitiliaztions: None  Health Maintenance:    Flu: Never  Tetanus: Never  Pneumovax: Never  Prevnar: Never  Zostavax: Never  Shingrix: Never  COVID: Never  PSA: 10/2018  Colon Screening: 10/2005  Eye Doctor: As needed  Dental Exam: As needed   Providers:   PCP: Nicki Reaper,  NP   I have personally reviewed and have noted:  1. The patient's medical and social history 2. Their use of alcohol, tobacco or illicit drugs 3. Their current medications and supplements 4. The patient's functional ability including ADL's, fall risks, home safety risks and hearing or visual impairment. 5. Diet and physical activities 6. Evidence for depression or mood disorder  Subjective:   Review of Systems:   Constitutional: Denies fever, malaise, fatigue, headache or abrupt weight changes.  HEENT: Denies eye pain, eye redness, ear pain, ringing in the ears, wax buildup, runny nose, nasal congestion, bloody nose, or sore throat. Respiratory: Denies difficulty breathing, shortness of breath, cough or sputum production.   Cardiovascular: Denies chest pain, chest tightness, palpitations  or swelling in the hands or feet.  Gastrointestinal: Denies abdominal pain, bloating, constipation, diarrhea or blood in the stool.  GU: Denies urgency, frequency, pain with urination, burning sensation, blood in urine, odor or discharge. Musculoskeletal: Pt reports chronic low back pain. Denies decrease in range of motion, difficulty with gait, muscle pain or joint swelling.  Skin: Denies redness, rashes, lesions or ulcercations.  Neurological: Denies dizziness, difficulty with memory, difficulty with speech or problems with balance and coordination.  Psych: Denies anxiety, depression, SI/HI.  No other specific complaints in a complete review of systems (except as listed in HPI above).  Objective:  PE:   BP 128/80   Pulse 94   Temp (!) 97.4 F (36.3 C) (Temporal)   Ht 5\' 11"  (1.803 m)   Wt 194 lb (88 kg)   SpO2 100%   BMI 27.06 kg/m   Wt Readings from Last 3 Encounters:  10/15/19 200 lb (90.7 kg)  10/20/18 203 lb (92.1 kg)  10/08/18 210 lb (95.3 kg)    General: Appears his stated age, well developed, well nourished in NAD. Skin: Warm, dry and intact. No rashes noted. Multiple skin tags noted of head and neck. Seborrheic keratoses noted of back. HEENT: Head: normal shape and size; Eyes: sclera white, no icterus, conjunctiva pink, PERRLA and EOMs intact;  Neck: Neck supple, trachea midline. No masses, lumps or thyromegaly present.  Cardiovascular: Normal rate and rhythm. S1,S2 noted.  No murmur, rubs or gallops noted. No JVD or BLE edema. No carotid bruits noted. Pulmonary/Chest: Normal effort and positive vesicular breath sounds. No respiratory distress. No wheezes, rales or ronchi noted.  Abdomen: Soft and nontender. Normal bowel sounds. Ventral hernia noted. Liver, spleen and kidneys non palpable. Musculoskeletal: Strength 5/5 BUE/BLE. No difficulty with gait. Neurological: Alert and oriented. Cranial nerves II-XII grossly intact. Coordination normal.  Psychiatric: Mood and  affect normal. Behavior is normal. Judgment and thought content normal.   ECG: Normal rate and rhythm, evidence of left atrial enlargement, unchanged from prior ECG in 2018  BMET    Component Value Date/Time   NA 138 10/20/2018 1009   K 5.1 10/20/2018 1009   CL 100 10/20/2018 1009   CO2 32 10/20/2018 1009   GLUCOSE 112 (H) 10/20/2018 1009   BUN 15 10/20/2018 1009   CREATININE 0.99 10/20/2018 1009   CREATININE 1.00 08/31/2016 1513   CALCIUM 10.5 10/20/2018 1009    Lipid Panel     Component Value Date/Time   CHOL 212 (H) 10/20/2018 1009   TRIG 165.0 (H) 10/20/2018 1009   HDL 41.90 10/20/2018 1009   CHOLHDL 5 10/20/2018 1009   VLDL 33.0 10/20/2018 1009   LDLCALC 137 (H) 10/20/2018 1009    CBC    Component Value Date/Time  WBC 6.8 10/20/2018 1009   RBC 5.41 10/20/2018 1009   HGB 18.1 Repeated and verified X2. (HH) 10/20/2018 1009   HCT 53.6 (H) 10/20/2018 1009   PLT 212.0 10/20/2018 1009   MCV 99.0 10/20/2018 1009   MCH 33.2 09/03/2016 1542   MCHC 33.8 10/20/2018 1009   RDW 14.0 10/20/2018 1009   LYMPHSABS 2.2 09/03/2016 1542   MONOABS 1.0 09/03/2016 1542   EOSABS 0.1 09/03/2016 1542   BASOSABS 0.1 09/03/2016 1542    Hgb A1C Lab Results  Component Value Date   HGBA1C 5.9 10/20/2018      Assessment and Plan:   Medicare Annual Wellness Visit:  Diet: He does eat meat. He consumes fruits and veggies. He does eat some fried foods. He drinks mostly water, juice, coffee. Physical activity: Sedentary Depression/mood screen: Negative, PHQ 9 score of 0 Hearing: Intact to whispered voice Visual acuity: Grossly normal ADLs: Capable Fall risk: None Home safety: Good Cognitive evaluation: Intact to orientation, naming, recall and repetition EOL planning: Living Will, full code/ I agree  Preventative Medicine: He declines flu, tetanus, pneumovax, prevnar, zostovax, shingrix or covid vaccine. He will consider getting a colonoscopy done in the fall. Encouraged him to  consume a balanced diet and exercise regimen. Advised him to see an eye doctor and dentist annually. Will check CBC, CMET, Lipid, PSA and Hep C today. Due dates for screening exams given to patient as part of his AVS   Next appointment: 1 year, Medicare Wellness Exam   Nicki Reaper, NP This visit occurred during the SARS-CoV-2 public health emergency.  Safety protocols were in place, including screening questions prior to the visit, additional usage of staff PPE, and extensive cleaning of exam room while observing appropriate contact time as indicated for disinfecting solutions.

## 2020-03-22 NOTE — Patient Instructions (Signed)

## 2020-03-22 NOTE — Assessment & Plan Note (Signed)
Controlled on Lisinopril HCT, refilled today CMET today Reinforced DASH diet and exercise for weight loss

## 2020-03-23 LAB — HEPATITIS C ANTIBODY
Hepatitis C Ab: NONREACTIVE
SIGNAL TO CUT-OFF: 0.01 (ref <1.00)

## 2020-04-07 ENCOUNTER — Other Ambulatory Visit: Payer: Self-pay

## 2020-04-07 ENCOUNTER — Ambulatory Visit (INDEPENDENT_AMBULATORY_CARE_PROVIDER_SITE_OTHER): Payer: Medicare Other

## 2020-04-07 DIAGNOSIS — Z7901 Long term (current) use of anticoagulants: Secondary | ICD-10-CM | POA: Diagnosis not present

## 2020-04-07 LAB — POCT INR: INR: 3.3 — AB (ref 2.0–3.0)

## 2020-04-07 NOTE — Patient Instructions (Addendum)
Pre visit review using our clinic review tool, if applicable. No additional management support is needed unless otherwise documented below in the visit note.  HOLD dose tomorrow since you have already taken dose today and then continue to take 1 pill (5mg ) daily except take 7.5mg  on Mon, Wed, and Fri.  Recheck in 4 weeks.

## 2020-04-23 ENCOUNTER — Other Ambulatory Visit: Payer: Self-pay | Admitting: Internal Medicine

## 2020-05-05 ENCOUNTER — Other Ambulatory Visit: Payer: Self-pay

## 2020-05-05 ENCOUNTER — Ambulatory Visit (INDEPENDENT_AMBULATORY_CARE_PROVIDER_SITE_OTHER): Payer: Medicare Other

## 2020-05-05 DIAGNOSIS — Z7901 Long term (current) use of anticoagulants: Secondary | ICD-10-CM | POA: Diagnosis not present

## 2020-05-05 LAB — POCT INR: INR: 2.2 (ref 2.0–3.0)

## 2020-05-05 NOTE — Patient Instructions (Addendum)
Pre visit review using our clinic review tool, if applicable. No additional management support is needed unless otherwise documented below in the visit note.  Continue to take 1 pill (5mg ) daily except take 7.5mg  on Mon, Wed, and Fri.  Recheck in 4 weeks.

## 2020-05-19 ENCOUNTER — Other Ambulatory Visit: Payer: Self-pay | Admitting: Internal Medicine

## 2020-05-19 DIAGNOSIS — Z7901 Long term (current) use of anticoagulants: Secondary | ICD-10-CM

## 2020-05-19 NOTE — Telephone Encounter (Signed)
Patient has appt on 06/02/2020 at [facility]

## 2020-05-19 NOTE — Telephone Encounter (Signed)
Pharmacy requests refill on: Warfarin 5 mg   LAST REFILL: 02/24/2020 (Q-135, R-0) LAST OV: 05/05/2020 NEXT OV: 06/02/2020 PHARMACY: [facility]

## 2020-06-02 ENCOUNTER — Other Ambulatory Visit: Payer: Self-pay

## 2020-06-02 ENCOUNTER — Ambulatory Visit (INDEPENDENT_AMBULATORY_CARE_PROVIDER_SITE_OTHER): Payer: Medicare Other

## 2020-06-02 DIAGNOSIS — Z7901 Long term (current) use of anticoagulants: Secondary | ICD-10-CM | POA: Diagnosis not present

## 2020-06-02 LAB — POCT INR: INR: 2.4 (ref 2.0–3.0)

## 2020-06-02 NOTE — Patient Instructions (Addendum)
Pre visit review using our clinic review tool, if applicable. No additional management support is needed unless otherwise documented below in the visit note.  Continue to take 1 pill (5mg ) daily except take 7.5mg  on Mon, Wed, and Fri.  Recheck in 6 weeks.

## 2020-06-05 NOTE — Progress Notes (Signed)
Agree.  Thanks.  Is patient going to f/u here or with [facility]?  Please check with patient if not already done.

## 2020-06-09 NOTE — Progress Notes (Signed)
Pt has voiced that he would like to stay at this clinic and will establish care with one of the new NPs.

## 2020-06-13 NOTE — Progress Notes (Signed)
Noted. Thanks.

## 2020-07-14 ENCOUNTER — Ambulatory Visit (INDEPENDENT_AMBULATORY_CARE_PROVIDER_SITE_OTHER): Payer: Medicare Other

## 2020-07-14 ENCOUNTER — Other Ambulatory Visit: Payer: Self-pay

## 2020-07-14 DIAGNOSIS — Z7901 Long term (current) use of anticoagulants: Secondary | ICD-10-CM

## 2020-07-14 LAB — POCT INR: INR: 2.5 (ref 2.0–3.0)

## 2020-07-14 NOTE — Patient Instructions (Addendum)
Pre visit review using our clinic review tool, if applicable. No additional management support is needed unless otherwise documented below in the visit note.  Continue to take 1 pill (5mg) daily except take 7.5mg on Mon, Wed, and Fri.  Recheck in 6 weeks. 

## 2020-08-18 ENCOUNTER — Other Ambulatory Visit: Payer: Self-pay

## 2020-08-18 ENCOUNTER — Ambulatory Visit (INDEPENDENT_AMBULATORY_CARE_PROVIDER_SITE_OTHER): Payer: Medicare Other

## 2020-08-18 DIAGNOSIS — Z7901 Long term (current) use of anticoagulants: Secondary | ICD-10-CM

## 2020-08-18 LAB — POCT INR: INR: 4.6 — AB (ref 2.0–3.0)

## 2020-08-18 NOTE — Patient Instructions (Addendum)
Pre visit review using our clinic review tool, if applicable. No additional management support is needed unless otherwise documented below in the visit note.  Hold dose today and hold dose tomorrow and then change weekly dose to take 5 mg daily except take 7.5 mg on Mon and Thurs. Recheck in 3 weeks.

## 2020-08-19 NOTE — Progress Notes (Signed)
Agree. Thanks

## 2020-09-08 ENCOUNTER — Ambulatory Visit: Payer: Medicare Other

## 2020-09-09 ENCOUNTER — Ambulatory Visit (INDEPENDENT_AMBULATORY_CARE_PROVIDER_SITE_OTHER): Payer: Medicare Other

## 2020-09-09 ENCOUNTER — Other Ambulatory Visit: Payer: Self-pay

## 2020-09-09 DIAGNOSIS — Z7901 Long term (current) use of anticoagulants: Secondary | ICD-10-CM | POA: Diagnosis not present

## 2020-09-09 LAB — POCT INR: INR: 2.1 (ref 2.0–3.0)

## 2020-09-09 NOTE — Patient Instructions (Addendum)
Pre visit review using our clinic review tool, if applicable. No additional management support is needed unless otherwise documented below in the visit note.  Continue 5 mg daily except take 7.5 mg on Mon and Fri. Recheck in 4 weeks.

## 2020-09-11 NOTE — Progress Notes (Signed)
Agree. Thanks

## 2020-10-07 ENCOUNTER — Other Ambulatory Visit: Payer: Self-pay

## 2020-10-07 ENCOUNTER — Ambulatory Visit (INDEPENDENT_AMBULATORY_CARE_PROVIDER_SITE_OTHER): Payer: Medicare Other

## 2020-10-07 DIAGNOSIS — Z7901 Long term (current) use of anticoagulants: Secondary | ICD-10-CM

## 2020-10-07 LAB — POCT INR: INR: 2.9 (ref 2.0–3.0)

## 2020-10-07 NOTE — Patient Instructions (Addendum)
Pre visit review using our clinic review tool, if applicable. No additional management support is needed unless otherwise documented below in the visit note.  Continue 5 mg daily except take 7.5 mg on Mon and Fri. Recheck in 4 weeks. 

## 2020-10-09 NOTE — Progress Notes (Signed)
Agree. Thanks

## 2020-11-04 ENCOUNTER — Other Ambulatory Visit: Payer: Self-pay | Admitting: Internal Medicine

## 2020-11-04 ENCOUNTER — Other Ambulatory Visit: Payer: Self-pay

## 2020-11-04 ENCOUNTER — Ambulatory Visit (INDEPENDENT_AMBULATORY_CARE_PROVIDER_SITE_OTHER): Payer: Medicare Other

## 2020-11-04 DIAGNOSIS — Z7901 Long term (current) use of anticoagulants: Secondary | ICD-10-CM

## 2020-11-04 LAB — POCT INR: INR: 2.9 (ref 2.0–3.0)

## 2020-11-04 NOTE — Patient Instructions (Addendum)
Pre visit review using our clinic review tool, if applicable. No additional management support is needed unless otherwise documented below in the visit note.  Continue 5 mg daily except take 7.5 mg on Mon and Fri. Recheck in 4 weeks. 

## 2020-11-07 NOTE — Telephone Encounter (Signed)
Pt is compliant with coumadin management.  Sent in refll

## 2020-12-02 ENCOUNTER — Ambulatory Visit (INDEPENDENT_AMBULATORY_CARE_PROVIDER_SITE_OTHER): Payer: Medicare Other

## 2020-12-02 ENCOUNTER — Other Ambulatory Visit: Payer: Self-pay

## 2020-12-02 DIAGNOSIS — Z7901 Long term (current) use of anticoagulants: Secondary | ICD-10-CM | POA: Diagnosis not present

## 2020-12-02 LAB — POCT INR: INR: 2.4 (ref 2.0–3.0)

## 2020-12-02 NOTE — Progress Notes (Signed)
Continue 5 mg daily except take 7.5 mg on Mon and Fri. Recheck in 6 weeks.

## 2020-12-02 NOTE — Patient Instructions (Addendum)
Pre visit review using our clinic review tool, if applicable. No additional management support is needed unless otherwise documented below in the visit note.  Continue 5 mg daily except take 7.5 mg on Mon and Fri. Recheck in 6 weeks. 

## 2021-01-12 DIAGNOSIS — H2513 Age-related nuclear cataract, bilateral: Secondary | ICD-10-CM | POA: Diagnosis not present

## 2021-01-12 DIAGNOSIS — H5203 Hypermetropia, bilateral: Secondary | ICD-10-CM | POA: Diagnosis not present

## 2021-01-13 ENCOUNTER — Ambulatory Visit (INDEPENDENT_AMBULATORY_CARE_PROVIDER_SITE_OTHER): Payer: Medicare Other

## 2021-01-13 ENCOUNTER — Other Ambulatory Visit: Payer: Self-pay

## 2021-01-13 DIAGNOSIS — Z7901 Long term (current) use of anticoagulants: Secondary | ICD-10-CM | POA: Diagnosis not present

## 2021-01-13 LAB — POCT INR: INR: 2.3 (ref 2.0–3.0)

## 2021-01-13 NOTE — Patient Instructions (Addendum)
Pre visit review using our clinic review tool, if applicable. No additional management support is needed unless otherwise documented below in the visit note.  Continue 5 mg daily except take 7.5 mg on Mon and Fri. Recheck in 6 weeks. 

## 2021-01-13 NOTE — Progress Notes (Addendum)
Continue 5 mg daily except take 7.5 mg on Mon and Fri. Recheck in 6 weeks.   ==================== Agree.  Thanks.  Crawford Givens

## 2021-02-01 ENCOUNTER — Other Ambulatory Visit: Payer: Self-pay | Admitting: Internal Medicine

## 2021-02-02 NOTE — Telephone Encounter (Signed)
Name of Medication: Lisinopril HCTZ 10-12.5 mg Name of Pharmacy: [facility] Date and Quantity: #90 x 3 on 04/25/2020 Last Office Visit and Type: 03/22/2020 welcome to medicare Next Office Visit and Type: none scheduled  Sending note to [facility] pool

## 2021-02-24 ENCOUNTER — Ambulatory Visit (INDEPENDENT_AMBULATORY_CARE_PROVIDER_SITE_OTHER): Payer: Medicare Other

## 2021-02-24 ENCOUNTER — Other Ambulatory Visit: Payer: Self-pay

## 2021-02-24 DIAGNOSIS — Z7901 Long term (current) use of anticoagulants: Secondary | ICD-10-CM | POA: Diagnosis not present

## 2021-02-24 LAB — POCT INR: INR: 2.7 (ref 2.0–3.0)

## 2021-02-24 NOTE — Patient Instructions (Addendum)
Pre visit review using our clinic review tool, if applicable. No additional management support is needed unless otherwise documented below in the visit note.  Continue 5 mg daily except take 7.5 mg on Mon and Fri. Recheck in 5 weeks.

## 2021-02-24 NOTE — Progress Notes (Signed)
Continue 5 mg daily except take 7.5 mg on Mon and Fri. Recheck in 5 weeks.

## 2021-02-27 ENCOUNTER — Telehealth: Payer: Self-pay | Admitting: Acute Care

## 2021-02-27 NOTE — Telephone Encounter (Signed)
Left message for pt to call to schedule f/u lung screening CT scan.  °

## 2021-03-06 ENCOUNTER — Other Ambulatory Visit: Payer: Self-pay

## 2021-03-06 DIAGNOSIS — F17210 Nicotine dependence, cigarettes, uncomplicated: Secondary | ICD-10-CM

## 2021-03-06 DIAGNOSIS — Z87891 Personal history of nicotine dependence: Secondary | ICD-10-CM

## 2021-03-17 ENCOUNTER — Other Ambulatory Visit: Payer: Self-pay

## 2021-03-17 ENCOUNTER — Ambulatory Visit
Admission: RE | Admit: 2021-03-17 | Discharge: 2021-03-17 | Disposition: A | Discharge status: Home / Self Care | Payer: Medicare Other | Source: Ambulatory Visit | Attending: Acute Care | Admitting: Acute Care

## 2021-03-17 DIAGNOSIS — Z87891 Personal history of nicotine dependence: Secondary | ICD-10-CM | POA: Diagnosis not present

## 2021-03-17 DIAGNOSIS — F17210 Nicotine dependence, cigarettes, uncomplicated: Secondary | ICD-10-CM | POA: Diagnosis not present

## 2021-03-20 ENCOUNTER — Other Ambulatory Visit: Payer: Self-pay

## 2021-03-20 DIAGNOSIS — F17210 Nicotine dependence, cigarettes, uncomplicated: Secondary | ICD-10-CM

## 2021-03-20 DIAGNOSIS — Z87891 Personal history of nicotine dependence: Secondary | ICD-10-CM

## 2021-03-30 ENCOUNTER — Ambulatory Visit (INDEPENDENT_AMBULATORY_CARE_PROVIDER_SITE_OTHER): Payer: Medicare Other

## 2021-03-30 ENCOUNTER — Other Ambulatory Visit: Payer: Self-pay

## 2021-03-30 DIAGNOSIS — Z7901 Long term (current) use of anticoagulants: Secondary | ICD-10-CM | POA: Diagnosis not present

## 2021-03-30 LAB — POCT INR: INR: 3.1 — AB (ref 2.0–3.0)

## 2021-03-30 NOTE — Patient Instructions (Addendum)
Pre visit review using our clinic review tool, if applicable. No additional management support is needed unless otherwise documented below in the visit note. ? ?Only take 1 tablet tomorrow and then continue 5 mg daily except take 7.5 mg on Mon and Fri. Recheck in 4 weeks. ?

## 2021-03-30 NOTE — Progress Notes (Signed)
Only take 1 tablet tomorrow and then continue 5 mg daily except take 7.5 mg on Mon and Fri. Recheck in 4 weeks. ?

## 2021-03-31 ENCOUNTER — Ambulatory Visit: Payer: Medicare Other

## 2021-04-24 ENCOUNTER — Encounter: Payer: Medicare Other | Admitting: Family

## 2021-04-27 ENCOUNTER — Ambulatory Visit (INDEPENDENT_AMBULATORY_CARE_PROVIDER_SITE_OTHER): Payer: Medicare Other

## 2021-04-27 ENCOUNTER — Other Ambulatory Visit: Payer: Self-pay | Admitting: Family

## 2021-04-27 DIAGNOSIS — Z7901 Long term (current) use of anticoagulants: Secondary | ICD-10-CM | POA: Diagnosis not present

## 2021-04-27 LAB — POCT INR: INR: 2.7 (ref 2.0–3.0)

## 2021-04-27 NOTE — Patient Instructions (Addendum)
Pre visit review using our clinic review tool, if applicable. No additional management support is needed unless otherwise documented below in the visit note.  Continue 5 mg daily except take 7.5 mg on Mon and Fri. Recheck in 6 weeks. 

## 2021-04-27 NOTE — Progress Notes (Signed)
Continue 5 mg daily except take 7.5 mg on Mon and Fri. Recheck in 6 weeks. 

## 2021-06-08 ENCOUNTER — Ambulatory Visit (INDEPENDENT_AMBULATORY_CARE_PROVIDER_SITE_OTHER): Payer: Medicare Other

## 2021-06-08 DIAGNOSIS — Z7901 Long term (current) use of anticoagulants: Secondary | ICD-10-CM | POA: Diagnosis not present

## 2021-06-08 LAB — POCT INR: INR: 2.9 (ref 2.0–3.0)

## 2021-06-08 NOTE — Patient Instructions (Addendum)
Pre visit review using our clinic review tool, if applicable. No additional management support is needed unless otherwise documented below in the visit note.  Continue 5 mg daily except take 7.5 mg on Mon and Fri. Recheck in 7 weeks.

## 2021-06-08 NOTE — Progress Notes (Addendum)
Continue 5 mg daily except take 7.5 mg on Mon and Fri. Recheck in 7 weeks per pt request due to being out of town.

## 2021-07-03 ENCOUNTER — Other Ambulatory Visit: Payer: Self-pay | Admitting: Family Medicine

## 2021-07-03 DIAGNOSIS — Z7901 Long term (current) use of anticoagulants: Secondary | ICD-10-CM

## 2021-07-04 NOTE — Telephone Encounter (Signed)
Pt is compliant with warfarin management and has been working to set up with a new PCP in office. He has made a TOC apt but it was rescheduled due to provider being out of the office several times. Sent in refill.

## 2021-07-27 ENCOUNTER — Ambulatory Visit (INDEPENDENT_AMBULATORY_CARE_PROVIDER_SITE_OTHER): Payer: Medicare Other

## 2021-07-27 DIAGNOSIS — I10 Essential (primary) hypertension: Secondary | ICD-10-CM

## 2021-07-27 DIAGNOSIS — Z7901 Long term (current) use of anticoagulants: Secondary | ICD-10-CM | POA: Diagnosis not present

## 2021-07-27 LAB — POCT INR: INR: 2.9 (ref 2.0–3.0)

## 2021-07-27 MED ORDER — LISINOPRIL-HYDROCHLOROTHIAZIDE 10-12.5 MG PO TABS: 1.0000 | ORAL_TABLET | Freq: Every day | ORAL | 0 refills | Status: DC

## 2021-07-27 NOTE — Progress Notes (Addendum)
Continue 5 mg daily except t  Pt reported he will be out of Zestoric within a week. He has made apts with new provider since his left but they were cancelled by the provider due to being out of the office.  Sent in refill for 30 days and pt will make an apt on his way out for TOC apt.

## 2021-07-27 NOTE — Patient Instructions (Addendum)
Pre visit review using our clinic review tool, if applicable. No additional management support is needed unless otherwise documented below in the visit note.  Continue 5 mg daily except take 7.5 mg on Mon and Fri. Recheck in 6 weeks.

## 2021-07-28 ENCOUNTER — Other Ambulatory Visit: Payer: Medicare Other

## 2021-08-03 ENCOUNTER — Encounter: Payer: Self-pay | Admitting: Family

## 2021-08-03 ENCOUNTER — Ambulatory Visit (INDEPENDENT_AMBULATORY_CARE_PROVIDER_SITE_OTHER): Payer: Medicare Other | Admitting: Family

## 2021-08-03 VITALS — BP 134/74 | HR 87 | Temp 98.6°F | Resp 16 | Ht 72.0 in | Wt 200.5 lb

## 2021-08-03 DIAGNOSIS — R760 Raised antibody titer: Secondary | ICD-10-CM | POA: Insufficient documentation

## 2021-08-03 DIAGNOSIS — J439 Emphysema, unspecified: Secondary | ICD-10-CM

## 2021-08-03 DIAGNOSIS — R7303 Prediabetes: Secondary | ICD-10-CM | POA: Diagnosis not present

## 2021-08-03 DIAGNOSIS — I10 Essential (primary) hypertension: Secondary | ICD-10-CM

## 2021-08-03 DIAGNOSIS — J432 Centrilobular emphysema: Secondary | ICD-10-CM

## 2021-08-03 DIAGNOSIS — I2510 Atherosclerotic heart disease of native coronary artery without angina pectoris: Secondary | ICD-10-CM | POA: Diagnosis not present

## 2021-08-03 DIAGNOSIS — I700 Atherosclerosis of aorta: Secondary | ICD-10-CM

## 2021-08-03 DIAGNOSIS — K635 Polyp of colon: Secondary | ICD-10-CM | POA: Diagnosis not present

## 2021-08-03 DIAGNOSIS — I82462 Acute embolism and thrombosis of left calf muscular vein: Secondary | ICD-10-CM | POA: Diagnosis not present

## 2021-08-03 DIAGNOSIS — Z0001 Encounter for general adult medical examination with abnormal findings: Secondary | ICD-10-CM

## 2021-08-03 DIAGNOSIS — E7800 Pure hypercholesterolemia, unspecified: Secondary | ICD-10-CM | POA: Diagnosis not present

## 2021-08-03 DIAGNOSIS — Z0000 Encounter for general adult medical examination without abnormal findings: Secondary | ICD-10-CM

## 2021-08-03 DIAGNOSIS — Z1211 Encounter for screening for malignant neoplasm of colon: Secondary | ICD-10-CM

## 2021-08-03 DIAGNOSIS — I2584 Coronary atherosclerosis due to calcified coronary lesion: Secondary | ICD-10-CM

## 2021-08-03 MED ORDER — LISINOPRIL-HYDROCHLOROTHIAZIDE 10-12.5 MG PO TABS: 1.0000 | ORAL_TABLET | Freq: Every day | ORAL | 3 refills | Status: DC

## 2021-08-03 NOTE — Assessment & Plan Note (Signed)
Continue warfarin however after reviewing old notes from hematology pt was to f/u with lupus anticoagulation repeat back in 2018, and had not. Will refer back for eval/treat

## 2021-08-03 NOTE — Assessment & Plan Note (Signed)
Referred for colonoscopy

## 2021-08-03 NOTE — Assessment & Plan Note (Signed)
D/w pt need for repeat colonoscopy, pt will consider Placed referrals.

## 2021-08-03 NOTE — Assessment & Plan Note (Signed)
Referral placed for hematology to determine if this was false or true positive.

## 2021-08-03 NOTE — Progress Notes (Signed)
Established Patient Office Visit  Subjective:  Patient ID: Walter Maddox, male    DOB: [birth year]  Age: 66 y.o. MRN: [id number]  CC:  Chief Complaint  Patient presents with  . Annual Exam    HPI Walter Maddox is here for a transition of care visit.  Prior provider was: Nicki Reaper, FNP   chronic concerns:  08/14/2016: left lower ext DVT . Started on xarelto but was too expensive switched to warfarin. Did see hematology Dr. Cathie Hoops back in 09/03/2016. Evaluated for DVT. Negative for factor V Leiden mutation, prothrombin mutation, anti cardiolipin antibody positive for lupus anticoagulant however was suspected false positive as on xarelto at the time. Hematologist stated pt to be on long term anticoagulation due to unprovoked proximal dvt.   Macrocytosis workup with hematology, resolved. Neutrophilia per hemotology likely due to smoking.   Was also started on lung cancer screening, and has yearly CT scans for smoking history.   Copd with centrilobular emphysema: still a smoker. Annual CT scans, screening. Last was 03/17/21, stable. Has smoked x 60 years.   Past Medical History:  Diagnosis Date  . Back pain   . Chronic pain of right hip   . Dupuytren's contracture of both hands   . DVT of lower limb, acute (HCC) 08/17/2016   per pt left leg  . Elbow pain, chronic   . Hyperlipidemia   . Hypertension   . Severe flexion contractures of all joints 2009   bilateral hands some finger immobility per pt    Past Surgical History:  Procedure Laterality Date  . NO PAST SURGERIES      Family History  Problem Relation Age of Onset  . Heart disease Mother   . Parkinson's disease Mother   . Dementia Mother   . Pulmonary embolism Mother   . Diabetes Father   . Hypercholesterolemia Sister   . Hypercholesterolemia Sister   . Breast cancer Sister     Social History   Socioeconomic History  . Marital status: Divorced    Spouse name: Not on file  . Number of children: 3  . Years of  education: Not on file  . Highest education level: Not on file  Occupational History  . Occupation: disabled    Comment: retired  Tobacco Use  . Smoking status: Every Day    Packs/day: 1.00    Years: 60.00    Total pack years: 60.00    Types: Cigarettes  . Smokeless tobacco: Never  . Tobacco comments:    pt refd smoking cessation info  Vaping Use  . Vaping Use: Never used  Substance and Sexual Activity  . Alcohol use: Yes    Comment: beer socially  . Drug use: No  . Sexual activity: Not Currently  Other Topics Concern  . Not on file  Social History Narrative  . Not on file   Social Determinants of Health   Financial Resource Strain: Not on file  Food Insecurity: Not on file  Transportation Needs: Not on file  Physical Activity: Not on file  Stress: Not on file  Social Connections: Not on file  Intimate Partner Violence: Not on file    Outpatient Medications Prior to Visit  Medication Sig Dispense Refill  . warfarin (COUMADIN) 5 MG tablet TAKE 1 TABLET BY MOUTH DAILY  EXCEPT TAKE 1 AND 1/2 TABLETS ON MONDAYS AND FRIDAYS OR AS  DIRECTED BY ANTICOAGULATION  CLINIC 123 tablet 2  . lisinopril-hydrochlorothiazide (ZESTORETIC) 10-12.5 MG tablet Take 1 tablet  by mouth daily. 30 tablet 0   No facility-administered medications prior to visit.    No Known Allergies  ROS Review of Systems  Review of Systems  Respiratory:  Negative for shortness of breath.   Cardiovascular:  Negative for chest pain and palpitations.  Gastrointestinal:  Negative for constipation and diarrhea.  Genitourinary:  Negative for dysuria, frequency and urgency.  Musculoskeletal:  Negative for myalgias.  Psychiatric/Behavioral:  Negative for depression and suicidal ideas.   All other systems reviewed and are negative.    Objective:    Physical Exam Constitutional:      General: He is not in acute distress.    Appearance: Normal appearance. He is normal weight. He is not ill-appearing,  toxic-appearing or diaphoretic.  HENT:     Head: Normocephalic.     Right Ear: Tympanic membrane normal.     Left Ear: Tympanic membrane normal.     Nose: Nose normal.  Eyes:     Pupils: Pupils are equal, round, and reactive to light.  Cardiovascular:     Rate and Rhythm: Normal rate and regular rhythm.     Pulses:          Dorsalis pedis pulses are 2+ on the right side and 2+ on the left side.       Posterior tibial pulses are 2+ on the right side and 2+ on the left side.  Pulmonary:     Effort: Pulmonary effort is normal.     Breath sounds: Normal breath sounds.  Abdominal:     Tenderness: There is no abdominal tenderness.  Musculoskeletal:        General: Normal range of motion.     Cervical back: Normal range of motion.     Right lower leg: No edema.     Left lower leg: No edema.  Skin:    General: Skin is warm.  Neurological:     General: No focal deficit present.     Mental Status: He is alert and oriented to person, place, and time.     Motor: No weakness.     Gait: Gait normal.  Psychiatric:        Mood and Affect: Mood normal.        Behavior: Behavior normal.        Thought Content: Thought content normal.        Judgment: Judgment normal.     BP 134/74   Pulse 87   Temp 98.6 F (37 C)   Resp 16   Ht 6' (1.829 m)   Wt 200 lb 8 oz (90.9 kg)   SpO2 100%   BMI 27.19 kg/m  Wt Readings from Last 3 Encounters:  08/03/21 200 lb 8 oz (90.9 kg)  03/17/21 200 lb (90.7 kg)  03/22/20 194 lb (88 kg)     Health Maintenance Due  Topic Date Due  . COLONOSCOPY (Pts 45-10yrs Insurance coverage will need to be confirmed)  11/13/2015    There are no preventive care reminders to display for this patient.  No results found for: "TSH" Lab Results  Component Value Date   WBC 8.1 03/22/2020   HGB 16.6 03/22/2020   HCT 48.8 03/22/2020   MCV 96.9 03/22/2020   PLT 213.0 03/22/2020   Lab Results  Component Value Date   NA 138 03/22/2020   K 4.7 03/22/2020   CO2  31 03/22/2020   GLUCOSE 106 (H) 03/22/2020   BUN 14 03/22/2020   CREATININE 0.91 03/22/2020  BILITOT 0.6 03/22/2020   ALKPHOS 83 03/22/2020   AST 30 03/22/2020   ALT 40 03/22/2020   PROT 6.8 03/22/2020   ALBUMIN 4.3 03/22/2020   CALCIUM 10.1 03/22/2020   GFR 88.68 03/22/2020   Lab Results  Component Value Date   CHOL 222 (H) 03/22/2020   Lab Results  Component Value Date   HDL 50.30 03/22/2020   Lab Results  Component Value Date   LDLCALC 134 (H) 03/22/2020   Lab Results  Component Value Date   TRIG 189.0 (H) 03/22/2020   Lab Results  Component Value Date   CHOLHDL 4 03/22/2020   Lab Results  Component Value Date   HGBA1C 5.9 10/20/2018      Assessment & Plan:   Problem List Items Addressed This Visit       Cardiovascular and Mediastinum   Essential hypertension    Refill lisinopril hctz 10-12.5 mg  Pt advised of the following:  Continue medication as prescribed. Monitor blood pressure periodically and/or when you feel symptomatic. Goal is <130/90 on average. Ensure that you have rested for 30 minutes prior to checking your blood pressure. Record your readings and bring them to your next visit if necessary.work on a low sodium diet.       Relevant Medications   lisinopril-hydrochlorothiazide (ZESTORETIC) 10-12.5 MG tablet   Acute deep vein thrombosis (DVT) of calf muscle vein of left lower extremity (HCC)    Continue warfarin however after reviewing old notes from hematology pt was to f/u with lupus anticoagulation repeat back in 2018, and had not. Will refer back for eval/treat      Relevant Medications   lisinopril-hydrochlorothiazide (ZESTORETIC) 10-12.5 MG tablet   Other Relevant Orders   Ambulatory referral to Hematology / Oncology   Coronary artery disease due to calcified coronary lesion    Work on low chol diet exercise as tolerated Lipids ordered, pending this may need to consider statin      Relevant Medications    lisinopril-hydrochlorothiazide (ZESTORETIC) 10-12.5 MG tablet   Aortic atherosclerosis (HCC)   Relevant Medications   lisinopril-hydrochlorothiazide (ZESTORETIC) 10-12.5 MG tablet     Respiratory   COPD (chronic obstructive pulmonary disease) (HCC)    Stable will continue to monitor.  Encouraged smoking cessation.  Continue with CT lung cancer screening annually Reviewed CT lung      Centrilobular emphysema (HCC)     Digestive   Polyp of colon    D/w pt need for repeat colonoscopy, pt will consider Placed referrals.       Relevant Orders   Ambulatory referral to Gastroenterology     Other   Pure hypercholesterolemia    Lipid panel ordered pending results.  Work on [employer] exercise as tolerated      Relevant Medications   lisinopril-hydrochlorothiazide (ZESTORETIC) 10-12.5 MG tablet   Antiphospholipid antibody positive - Primary    Referral placed for hematology to determine if this was false or true positive.       Relevant Orders   Ambulatory referral to Hematology / Oncology   Prediabetes    a1c ordered pending results  Work on diabetic diet       Screening for colon cancer    Referred for colonoscopy       Relevant Orders   Ambulatory referral to Gastroenterology   Encounter for medical examination to establish care   Encounter for general adult medical examination with abnormal findings    Patient Counseling(The following topics were reviewed):  Preventative care  handout given to pt  Health maintenance and immunizations reviewed. Please refer to Health maintenance section. Pt advised on safe sex, wearing seatbelts in car, and proper nutrition labwork ordered today for annual Dental health: Discussed importance of regular tooth brushing, flossing, and dental visits.         Meds ordered this encounter  Medications  . lisinopril-hydrochlorothiazide (ZESTORETIC) 10-12.5 MG tablet    Sig: Take 1 tablet by mouth daily.    Dispense:  90 tablet     Refill:  3    Requesting 1 year supply    Order Specific Question:   Supervising Provider    Answer:   Ermalene Searing, AMY E [[id number]    Follow-up: Return in about 6 months (around 02/03/2022) for regular follow up appt .    Mort Sawyers, FNP

## 2021-08-03 NOTE — Assessment & Plan Note (Signed)
Lipid panel ordered pending results. Work on low chol diet exercise as tolerated  

## 2021-08-03 NOTE — Patient Instructions (Addendum)
A referral was placed today for hematology.  Please let us know if you have not heard back within 2 weeks about the referral.  Welcome to our clinic, I am happy to have you as my new patient. I am excited to continue on this healthcare journey with you.  I have created an order for lab work today during our visit.  Please schedule an appointment on your way out to return to the lab at your convenience. Please return fasting at your lab appointment (meaning you can only drink black coffee and or water prior to your appointment). I will reach out to you in regards to the labs when I receive the results.   Please keep in mind Any my chart messages you send have up to a three business day turnaround for a response.  Phone calls may take up to a one full business day turnaround for a  response.   If you need a medication refill I recommend you request it through the pharmacy as this is easiest for [place] rather than sending a message and or phone call.   Due to recent changes in healthcare laws, you may see results of your imaging and/or laboratory studies on MyChart before I have had a chance to review them.  I understand that in some cases there may be results that are confusing or concerning to you. Please understand that not all results are received at the same time and often I may need to interpret multiple results in order to provide you with the best plan of care or course of treatment. Therefore, I ask that you please give me 2 business days to thoroughly review all your results before contacting my office for clarification. Should we see a critical lab result, you will be contacted sooner.   It was a pleasure seeing you today! Please do not hesitate to reach out with any questions and or concerns.  Regards,   Mort Sawyers FNP-C

## 2021-08-03 NOTE — Assessment & Plan Note (Signed)
Refill lisinopril hctz 10-12.5 mg  Pt advised of the following:  Continue medication as prescribed. Monitor blood pressure periodically and/or when you feel symptomatic. Goal is <130/90 on average. Ensure that you have rested for 30 minutes prior to checking your blood pressure. Record your readings and bring them to your next visit if necessary.work on a low sodium diet.

## 2021-08-03 NOTE — Assessment & Plan Note (Signed)
a1c ordered pending results ?Work on diabetic diet ?

## 2021-08-03 NOTE — Assessment & Plan Note (Signed)
Stable will continue to monitor.  Encouraged smoking cessation.  Continue with CT lung cancer screening annually Reviewed CT lung

## 2021-08-03 NOTE — Assessment & Plan Note (Signed)
Patient Counseling(The following topics were reviewed): ? Preventative care handout given to pt  ?Health maintenance and immunizations reviewed. Please refer to Health maintenance section. ?Pt advised on safe sex, wearing seatbelts in car, and proper nutrition ?labwork ordered today for annual ?Dental health: Discussed importance of regular tooth brushing, flossing, and dental visits. ? ? ?

## 2021-08-03 NOTE — Assessment & Plan Note (Signed)
Work on [employer] exercise as tolerated Lipids ordered, pending this may need to consider statin

## 2021-08-04 ENCOUNTER — Other Ambulatory Visit: Payer: Self-pay | Admitting: Family

## 2021-08-04 ENCOUNTER — Other Ambulatory Visit (INDEPENDENT_AMBULATORY_CARE_PROVIDER_SITE_OTHER): Payer: Medicare Other

## 2021-08-04 DIAGNOSIS — I82462 Acute embolism and thrombosis of left calf muscular vein: Secondary | ICD-10-CM | POA: Diagnosis not present

## 2021-08-04 DIAGNOSIS — R760 Raised antibody titer: Secondary | ICD-10-CM | POA: Diagnosis not present

## 2021-08-04 DIAGNOSIS — R739 Hyperglycemia, unspecified: Secondary | ICD-10-CM

## 2021-08-04 DIAGNOSIS — E7800 Pure hypercholesterolemia, unspecified: Secondary | ICD-10-CM | POA: Diagnosis not present

## 2021-08-04 DIAGNOSIS — I10 Essential (primary) hypertension: Secondary | ICD-10-CM | POA: Diagnosis not present

## 2021-08-04 LAB — CBC WITH DIFFERENTIAL/PLATELET
Basophils Absolute: 0.1 10*3/uL (ref 0.0–0.1)
Basophils Relative: 0.8 % (ref 0.0–3.0)
Eosinophils Absolute: 0.2 10*3/uL (ref 0.0–0.7)
Eosinophils Relative: 2.9 % (ref 0.0–5.0)
HCT: 49.8 % (ref 39.0–52.0)
Hemoglobin: 16.7 g/dL (ref 13.0–17.0)
Lymphocytes Relative: 26.2 % (ref 12.0–46.0)
Lymphs Abs: 2.2 10*3/uL (ref 0.7–4.0)
MCHC: 33.6 g/dL (ref 30.0–36.0)
MCV: 97.2 fl (ref 78.0–100.0)
Monocytes Absolute: 1.2 10*3/uL — ABNORMAL HIGH (ref 0.1–1.0)
Monocytes Relative: 13.9 % — ABNORMAL HIGH (ref 3.0–12.0)
Neutro Abs: 4.8 10*3/uL (ref 1.4–7.7)
Neutrophils Relative %: 56.2 % (ref 43.0–77.0)
Platelets: 197 10*3/uL (ref 150.0–400.0)
RBC: 5.12 Mil/uL (ref 4.22–5.81)
RDW: 13.5 % (ref 11.5–15.5)
WBC: 8.5 10*3/uL (ref 4.0–10.5)

## 2021-08-04 LAB — LIPID PANEL
Cholesterol: 211 mg/dL — ABNORMAL HIGH (ref 0–200)
HDL: 38.1 mg/dL — ABNORMAL LOW (ref >39.00)
LDL Cholesterol: 135 mg/dL — ABNORMAL HIGH (ref 0–99)
NonHDL: 173.32
Total CHOL/HDL Ratio: 6
Triglycerides: 192 mg/dL — ABNORMAL HIGH (ref 0.0–149.0)
VLDL: 38.4 mg/dL (ref 0.0–40.0)

## 2021-08-04 LAB — COMPREHENSIVE METABOLIC PANEL
ALT: 18 U/L (ref 0–53)
AST: 16 U/L (ref 0–37)
Albumin: 4.4 g/dL (ref 3.5–5.2)
Alkaline Phosphatase: 79 U/L (ref 39–117)
BUN: 12 mg/dL (ref 6–23)
CO2: 30 mEq/L (ref 19–32)
Calcium: 9.7 mg/dL (ref 8.4–10.5)
Chloride: 98 mEq/L (ref 96–112)
Creatinine, Ser: 0.94 mg/dL (ref 0.40–1.50)
GFR: 84.48 mL/min (ref >60.00)
Glucose, Bld: 104 mg/dL — ABNORMAL HIGH (ref 70–99)
Potassium: 3.9 mEq/L (ref 3.5–5.1)
Sodium: 136 mEq/L (ref 135–145)
Total Bilirubin: 0.6 mg/dL (ref 0.2–1.2)
Total Protein: 6.8 g/dL (ref 6.0–8.3)

## 2021-08-04 LAB — HEMOGLOBIN A1C: Hgb A1c MFr Bld: 6 % (ref 4.6–6.5)

## 2021-08-07 ENCOUNTER — Other Ambulatory Visit: Payer: Self-pay | Admitting: Family

## 2021-08-07 DIAGNOSIS — E7800 Pure hypercholesterolemia, unspecified: Secondary | ICD-10-CM

## 2021-08-07 DIAGNOSIS — D72821 Monocytosis (symptomatic): Secondary | ICD-10-CM

## 2021-08-07 MED ORDER — ATORVASTATIN CALCIUM 10 MG PO TABS: 10.0000 mg | ORAL_TABLET | Freq: Every day | ORAL | 3 refills | Status: DC

## 2021-08-07 NOTE — Progress Notes (Signed)
Walter Maddox is it too late to order a smear?   Cholesterol elevated Goal LDL is <70 you are at 135. Goal total cholesterol <200

## 2021-08-14 ENCOUNTER — Other Ambulatory Visit: Payer: Self-pay | Admitting: Family

## 2021-08-14 DIAGNOSIS — E7800 Pure hypercholesterolemia, unspecified: Secondary | ICD-10-CM

## 2021-08-14 MED ORDER — ATORVASTATIN CALCIUM 20 MG PO TABS: 20.0000 mg | ORAL_TABLET | Freq: Every day | ORAL | 3 refills | Status: DC

## 2021-08-14 NOTE — Progress Notes (Signed)
Slight elevation of monocytes  Pt to f/u one month in office and we will repeat  Cholesterol is a bit elevated, was pt fasting? Increase lipitor to 20 mg once daily. Will send to pharmacy.   Repeat cholesterol in about three months.

## 2021-08-16 NOTE — Progress Notes (Signed)
Yes see him in office in one month. We will decide if we need lab repeat in one month in office.

## 2021-08-18 ENCOUNTER — Inpatient Hospital Stay: Payer: Medicare Other | Attending: Oncology | Admitting: Oncology

## 2021-08-18 ENCOUNTER — Inpatient Hospital Stay: Payer: Medicare Other

## 2021-09-07 ENCOUNTER — Ambulatory Visit (INDEPENDENT_AMBULATORY_CARE_PROVIDER_SITE_OTHER): Payer: Medicare Other

## 2021-09-07 DIAGNOSIS — Z7901 Long term (current) use of anticoagulants: Secondary | ICD-10-CM

## 2021-09-07 LAB — POCT INR: INR: 3.8 — AB (ref 2.0–3.0)

## 2021-09-07 NOTE — Progress Notes (Signed)
Pt reported he has been taking ibuprofen due to helping a family member move. Educated pt about the use of ibuprofen and that tylenol is preferred. Pt verbalized understanding.  Due to pt being therapeutic on his current dosing there will not be a change in weekly dosing due to knowing the cause of the elevated INR result today most likely being from ibuprofen use.  Hold dose today and then continue 5 mg daily except take 7.5 mg on Mon and Fri. Recheck in 4 weeks per pt request due to being out of town.

## 2021-09-07 NOTE — Patient Instructions (Addendum)
Pre visit review using our clinic review tool, if applicable. No additional management support is needed unless otherwise documented below in the visit note.  Hold dose today and then continue 5 mg daily except take 7.5 mg on Mon and Fri. Recheck in 4 weeks

## 2021-09-19 ENCOUNTER — Ambulatory Visit: Payer: Medicare Other | Admitting: Family

## 2021-10-05 ENCOUNTER — Ambulatory Visit (INDEPENDENT_AMBULATORY_CARE_PROVIDER_SITE_OTHER): Payer: Medicare Other

## 2021-10-05 DIAGNOSIS — Z7901 Long term (current) use of anticoagulants: Secondary | ICD-10-CM

## 2021-10-05 LAB — POCT INR: INR: 3.2 — AB (ref 2.0–3.0)

## 2021-10-05 NOTE — Progress Notes (Signed)
Hold dose today and then change weekly dose to take 5 mg daily except take 7.5 mg on Mondays. Recheck in 4 weeks per pt request due to being out of town.

## 2021-10-05 NOTE — Patient Instructions (Addendum)
Pre visit review using our clinic review tool, if applicable. No additional management support is needed unless otherwise documented below in the visit note.  Hold dose today and then change weekly dose to take 5 mg daily except take 7.5 mg on Mondays. Recheck in 4 weeks

## 2021-11-02 ENCOUNTER — Telehealth: Payer: Self-pay | Admitting: Family

## 2021-11-02 ENCOUNTER — Ambulatory Visit (INDEPENDENT_AMBULATORY_CARE_PROVIDER_SITE_OTHER): Payer: Medicare Other

## 2021-11-02 DIAGNOSIS — Z7901 Long term (current) use of anticoagulants: Secondary | ICD-10-CM | POA: Diagnosis not present

## 2021-11-02 LAB — POCT INR: INR: 2.9 (ref 2.0–3.0)

## 2021-11-02 NOTE — Progress Notes (Signed)
Continue to take 1 tablet (5 mg) daily except take 1 1/2 tablet (7.5 mg) on Mondays. Recheck in 4 weeks. 

## 2021-11-02 NOTE — Telephone Encounter (Signed)
Spoke with patient he req CB 11/13/21

## 2021-11-02 NOTE — Patient Instructions (Addendum)
Continue to take 1 tablet (5 mg) daily except take 1 1/2 tablet (7.5 mg) on Mondays. Recheck in 4 weeks.

## 2021-11-29 ENCOUNTER — Telehealth: Payer: Self-pay

## 2021-11-29 ENCOUNTER — Ambulatory Visit: Payer: Medicare Other

## 2021-11-29 NOTE — Telephone Encounter (Signed)
No answer when called for scheduled AWV. Left message. Okay to reschedule.  

## 2021-11-30 ENCOUNTER — Ambulatory Visit (INDEPENDENT_AMBULATORY_CARE_PROVIDER_SITE_OTHER): Payer: Medicare Other

## 2021-11-30 DIAGNOSIS — Z7901 Long term (current) use of anticoagulants: Secondary | ICD-10-CM | POA: Diagnosis not present

## 2021-11-30 LAB — POCT INR: INR: 3 (ref 2.0–3.0)

## 2021-11-30 NOTE — Progress Notes (Signed)
Continue to take 1 tablet (5 mg) daily except take 1 1/2 tablet (7.5 mg) on Mondays. Recheck in 4 weeks. 

## 2021-11-30 NOTE — Patient Instructions (Signed)
Continue to take 1 tablet (5 mg) daily except take 1 1/2 tablet (7.5 mg) on Mondays. Recheck in 4 weeks.

## 2021-12-07 ENCOUNTER — Other Ambulatory Visit: Payer: Self-pay | Admitting: Family

## 2021-12-07 DIAGNOSIS — Z7901 Long term (current) use of anticoagulants: Secondary | ICD-10-CM

## 2022-01-04 ENCOUNTER — Ambulatory Visit (INDEPENDENT_AMBULATORY_CARE_PROVIDER_SITE_OTHER): Payer: Medicare Other

## 2022-01-04 DIAGNOSIS — Z7901 Long term (current) use of anticoagulants: Secondary | ICD-10-CM

## 2022-01-04 LAB — POCT INR: INR: 3.4 — AB (ref 2.0–3.0)

## 2022-01-04 NOTE — Progress Notes (Signed)
Hold dose today and then change weekly dose to take 1 tablet daily. Recheck in 3 weeks.

## 2022-01-04 NOTE — Patient Instructions (Addendum)
Pre visit review using our clinic review tool, if applicable. No additional management support is needed unless otherwise documented below in the visit note.  Hold dose today and then change weekly dose to take 1 tablet daily. Recheck in 3 weeks.

## 2022-01-25 ENCOUNTER — Ambulatory Visit (INDEPENDENT_AMBULATORY_CARE_PROVIDER_SITE_OTHER): Payer: Medicare Other

## 2022-01-25 DIAGNOSIS — Z7901 Long term (current) use of anticoagulants: Secondary | ICD-10-CM

## 2022-01-25 LAB — POCT INR: INR: 2.1 (ref 2.0–3.0)

## 2022-01-25 NOTE — Patient Instructions (Addendum)
Pre visit review using our clinic review tool, if applicable. No additional management support is needed unless otherwise documented below in the visit note.  Continue 1 tablet daily. Recheck in 6 weeks 

## 2022-01-25 NOTE — Progress Notes (Signed)
Continue 1 tablet daily.  Recheck in 6 weeks 

## 2022-03-08 ENCOUNTER — Ambulatory Visit (INDEPENDENT_AMBULATORY_CARE_PROVIDER_SITE_OTHER): Payer: Medicare Other

## 2022-03-08 DIAGNOSIS — Z7901 Long term (current) use of anticoagulants: Secondary | ICD-10-CM

## 2022-03-08 LAB — POCT INR: INR: 2.8 (ref 2.0–3.0)

## 2022-03-08 NOTE — Progress Notes (Signed)
Continue 1 tablet daily. Recheck in 6 weeks.

## 2022-03-08 NOTE — Patient Instructions (Addendum)
Pre visit review using our clinic review tool, if applicable. No additional management support is needed unless otherwise documented below in the visit note.  Continue 1 tablet daily. Recheck in 6 weeks.

## 2022-03-19 ENCOUNTER — Ambulatory Visit
Admission: RE | Admit: 2022-03-19 | Discharge: 2022-03-19 | Disposition: A | Discharge status: Home / Self Care | Payer: Medicare Other | Source: Ambulatory Visit | Attending: Acute Care | Admitting: Acute Care

## 2022-03-19 DIAGNOSIS — R911 Solitary pulmonary nodule: Secondary | ICD-10-CM | POA: Diagnosis not present

## 2022-03-19 DIAGNOSIS — Z122 Encounter for screening for malignant neoplasm of respiratory organs: Secondary | ICD-10-CM | POA: Insufficient documentation

## 2022-03-19 DIAGNOSIS — J439 Emphysema, unspecified: Secondary | ICD-10-CM | POA: Insufficient documentation

## 2022-03-19 DIAGNOSIS — Z87891 Personal history of nicotine dependence: Secondary | ICD-10-CM

## 2022-03-19 DIAGNOSIS — I700 Atherosclerosis of aorta: Secondary | ICD-10-CM | POA: Diagnosis not present

## 2022-03-19 DIAGNOSIS — I2510 Atherosclerotic heart disease of native coronary artery without angina pectoris: Secondary | ICD-10-CM | POA: Diagnosis not present

## 2022-03-19 DIAGNOSIS — F17210 Nicotine dependence, cigarettes, uncomplicated: Secondary | ICD-10-CM | POA: Diagnosis not present

## 2022-03-22 ENCOUNTER — Telehealth: Payer: Self-pay | Admitting: Acute Care

## 2022-03-22 ENCOUNTER — Telehealth: Payer: Self-pay | Admitting: *Deleted

## 2022-03-22 ENCOUNTER — Other Ambulatory Visit: Payer: Self-pay

## 2022-03-22 DIAGNOSIS — Z87891 Personal history of nicotine dependence: Secondary | ICD-10-CM

## 2022-03-22 DIAGNOSIS — R911 Solitary pulmonary nodule: Secondary | ICD-10-CM

## 2022-03-22 NOTE — Telephone Encounter (Signed)
Patient called back for LDCT results.  New lung nodule with recommendation to repeat LDCT in 6 months, as a precaution. Likely benign finding but prefer to image it once more sooner than 12 months.  Patient has not had any recent symptoms of illness, cough or congestion.  Atherosclerosis and emphysema, as previously noted.  Patient agrees with plan and order placed for 6 months nodule follow up LDCT.  Results/plan faxed to PCP

## 2022-03-22 NOTE — Telephone Encounter (Signed)
Left message for pt to call back regarding CT results.

## 2022-03-23 NOTE — Telephone Encounter (Signed)
See other telephone note from 03/22/2022

## 2022-03-28 ENCOUNTER — Ambulatory Visit (INDEPENDENT_AMBULATORY_CARE_PROVIDER_SITE_OTHER): Payer: Medicare Other

## 2022-03-28 VITALS — Ht 72.0 in | Wt 200.0 lb

## 2022-03-28 DIAGNOSIS — Z0000 Encounter for general adult medical examination without abnormal findings: Secondary | ICD-10-CM | POA: Diagnosis not present

## 2022-03-28 NOTE — Progress Notes (Signed)
I connected with  Walter Maddox on 03/28/22 by a audio enabled telemedicine application and verified that I am speaking with the correct person using two identifiers.  Patient Location: Home  Provider Location: Home Office  I discussed the limitations of evaluation and management by telemedicine. The patient expressed understanding and agreed to proceed.  Subjective:   Walter Maddox is a 67 y.o. male who presents for Medicare Annual/Subsequent preventive examination.  Review of Systems      Cardiac Risk Factors include: advanced age (>21mn, >>42women);hypertension;male gender;sedentary lifestyle     Objective:    Today's Vitals   03/28/22 0855  Weight: 200 lb (90.7 kg)  Height: 6' (1.829 m)   Body mass index is 27.12 kg/m.     03/28/2022    9:04 AM 09/18/2016   10:03 AM 09/03/2016    2:31 PM  Advanced Directives  Does Patient Have a Medical Advance Directive? No No No  Would patient like information on creating a medical advance directive? No - Patient declined      Current Medications (verified) Outpatient Encounter Medications as of 03/28/2022  Medication Sig   lisinopril-hydrochlorothiazide (ZESTORETIC) 10-12.5 MG tablet Take 1 tablet by mouth daily.   warfarin (COUMADIN) 5 MG tablet TAKE 1 TABLET BY MOUTH DAILY  EXCEPT TAKE 1 AND 1/2 TABLETS BY MOUTH ON MONDAYS AND FRIDAYS OR  AS DIRECTED BY ANTICOAGULATION  CLINIC   atorvastatin (LIPITOR) 20 MG tablet Take 1 tablet (20 mg total) by mouth daily. (Patient not taking: Reported on 03/28/2022)   No facility-administered encounter medications on file as of 03/28/2022.    Allergies (verified) Patient has no known allergies.   History: Past Medical History:  Diagnosis Date   Back pain    Chronic pain of right hip    Dupuytren's contracture of both hands    [place]   per pt left leg   Elbow pain, chronic    Hyperlipidemia    Hypertension    Severe flexion contractures of all  joints 2009   bilateral hands some finger immobility per pt   Past Surgical History:  Procedure Laterality Date   NO PAST SURGERIES     Family History  Problem Relation Age of Onset   Heart disease Mother    Parkinson's disease Mother    Dementia Mother    Pulmonary embolism Mother    Diabetes Father    Hypercholesterolemia Sister    Hypercholesterolemia Sister    Breast cancer Sister    Social History   Socioeconomic History   Marital status: Divorced    Spouse name: Not on file   Number of children: 3   Years of education: Not on file   Highest education level: Not on file  Occupational History   Occupation: disabled    Comment: retired  Tobacco Use   Smoking status: Every Day    Packs/day: 1.00    Years: 60.00    Total pack years: 60.00    Types: Cigarettes   Smokeless tobacco: Never   Tobacco comments:    pt refd smoking cessation info  Vaping Use   Vaping Use: Never used  Substance and Sexual Activity   Alcohol use: Yes    Comment: beer socially   Drug use: No   Sexual activity: Not Currently  Other Topics Concern   Not on file  Social History Narrative   Not on file   Social Determinants of HRadio broadcast assistant  Strain: Low Risk  (03/28/2022)   Overall Financial Resource Strain (CARDIA)    Difficulty of Paying Living Expenses: Not hard at all  Food Insecurity: No Food Insecurity (03/28/2022)   Hunger Vital Sign    Worried About Running Out of Food in the Last Year: Never true    Ran Out of Food in the Last Year: Never true  Transportation Needs: No Transportation Needs (03/28/2022)   PRAPARE - Hydrologist (Medical): No    Lack of Transportation (Non-Medical): No  Physical Activity: Inactive (03/28/2022)   Exercise Vital Sign    Days of Exercise per Week: 0 days    Minutes of Exercise per Session: 0 min  Stress: No Stress Concern Present (03/28/2022)   [place]    Feeling of Stress : Not at all  Social Connections: Socially Isolated (03/28/2022)   Social Connection and Isolation Panel [NHANES]    Frequency of Communication with Friends and Family: More than three times a week    Frequency of Social Gatherings with Friends and Family: More than three times a week    Attends Religious Services: Never    Marine scientist or Organizations: No    Attends Music therapist: Never    Marital Status: Divorced    Tobacco Counseling Ready to quit: Not Answered Counseling given: Not Answered Tobacco comments: pt refd smoking cessation info   Clinical Intake:  Pre-visit preparation completed: Yes  Pain : No/denies pain     Nutritional Risks: None Diabetes: No  How often do you need to have someone help you when you read instructions, pamphlets, or other written materials from your doctor or pharmacy?: 1 - Never  Diabetic? no  Interpreter Needed?: No  Information entered by :: C.Ercilia Bettinger LPN   Activities of Daily Living    03/28/2022    9:05 AM  In your present state of health, do you have any difficulty performing the following activities:  Hearing? 0  Vision? 0  Difficulty concentrating or making decisions? 0  Walking or climbing stairs? 0  Dressing or bathing? 0  Doing errands, shopping? 0  Preparing Food and eating ? N  Using the Toilet? N  In the past six months, have you accidently leaked urine? N  Do you have problems with loss of bowel control? N  Managing your Medications? N  Managing your Finances? N  Housekeeping or managing your Housekeeping? N    Patient Care Team: Eugenia Pancoast, FNP as PCP - General (Family Medicine)  Indicate any recent Medical Services you may have received from other than [facility] providers in the past year (date may be approximate).     Assessment:   This is a routine wellness examination for Walter Maddox.  Hearing/Vision screen Hearing Screening - Comments:: No  aids Vision Screening - Comments:: Glasses - [facility]  Dietary issues and exercise activities discussed: Current Exercise Habits: The patient does not participate in regular exercise at present, Exercise limited by: None identified   Goals Addressed             This Visit's Progress    Patient Stated       No new goals.       Depression Screen    03/28/2022    9:03 AM 08/03/2021   10:35 AM 03/22/2020    8:29 AM 10/20/2018    9:40 AM 07/09/2017    8:21 AM 08/16/2016  2:47 PM  PHQ 2/9 Scores  PHQ - 2 Score 0 0 0 0 0 0  PHQ- 9 Score  0 0       Fall Risk    03/28/2022    9:01 AM 08/03/2021    9:17 AM 03/22/2020    8:29 AM 08/16/2016    2:47 PM  [place] in the past year? 0 0 0 No  Number falls in past yr: 0 0    Injury with Fall? 0 0    Risk for fall due to : No Fall Risks No Fall Risks    Follow up Falls prevention discussed;Falls evaluation completed       FALL RISK PREVENTION PERTAINING TO THE HOME:  Any stairs in or around the home? Yes  If so, are there any without handrails? Yes  Home free of loose throw rugs in walkways, pet beds, electrical cords, etc? Yes  Adequate lighting in your home to reduce risk of falls? Yes   ASSISTIVE DEVICES UTILIZED TO PREVENT FALLS:  Life alert? No  Use of a cane, walker or w/c? No  Grab bars in the bathroom? No  Shower chair or bench in shower? No  Elevated toilet seat or a handicapped toilet? Yes   Cognitive Function:        03/28/2022    9:05 AM  6CIT Screen  What Year? 0 points  What month? 0 points  What time? 0 points  Count back from 20 0 points  Months in reverse 0 points  Repeat phrase 0 points  Total Score 0 points    Immunizations  There is no immunization history on file for this patient.  TDAP status: Due, Education has been provided regarding the importance of this vaccine. Advised may receive this vaccine at local pharmacy or Health Dept. Aware to provide a copy of the  vaccination record if obtained from local pharmacy or Health Dept. Verbalized acceptance and understanding.  Flu Vaccine status: Declined, Education has been provided regarding the importance of this vaccine but patient still declined. Advised may receive this vaccine at local pharmacy or Health Dept. Aware to provide a copy of the vaccination record if obtained from local pharmacy or Health Dept. Verbalized acceptance and understanding.  Pneumococcal vaccine status: Declined,  Education has been provided regarding the importance of this vaccine but patient still declined. Advised may receive this vaccine at local pharmacy or Health Dept. Aware to provide a copy of the vaccination record if obtained from local pharmacy or Health Dept. Verbalized acceptance and understanding.   Covid-19 vaccine status: Declined, Education has been provided regarding the importance of this vaccine but patient still declined. Advised may receive this vaccine at local pharmacy or Health Dept.or vaccine clinic. Aware to provide a copy of the vaccination record if obtained from local pharmacy or Health Dept. Verbalized acceptance and understanding.  Qualifies for Shingles Vaccine? Yes   Zostavax completed  declined   Shingrix Completed?: No.    Education has been provided regarding the importance of this vaccine. Patient has been advised to call insurance company to determine out of pocket expense if they have not yet received this vaccine. Advised may also receive vaccine at local pharmacy or Health Dept. Verbalized acceptance and understanding.  Screening Tests Health Maintenance  Topic Date Due   COVID-19 Vaccine (1) Never done   DTaP/Tdap/Td (1 - Tdap) Never done   Zoster Vaccines- Shingrix (1 of 2) Never done   COLONOSCOPY (Pts 45-53yr  Insurance coverage will need to be confirmed)  11/13/2015   Medicare Annual Wellness (AWV)  03/22/2021   INFLUENZA VACCINE  Never done   Pneumonia Vaccine 61+ Years old (1 of 2 -  PCV) 07/31/2022 (Originally 02/01/1961)   Lung Cancer Screening  03/19/2023   Hepatitis C Screening  Completed   HPV VACCINES  Aged Out    Health Maintenance  Health Maintenance Due  Topic Date Due   COVID-19 Vaccine (1) Never done   DTaP/Tdap/Td (1 - Tdap) Never done   Zoster Vaccines- Shingrix (1 of 2) Never done   COLONOSCOPY (Pts 45-73yr Insurance coverage will need to be confirmed)  11/13/2015   Medicare Annual Wellness (AWV)  03/22/2021   INFLUENZA VACCINE  Never done    Colorectal cancer screening: Type of screening: Colonoscopy. Completed 11/12/2005. Repeat every 10 years declined  Lung Cancer Screening: (Low Dose CT Chest recommended if Age 67-80years, 30 pack-year currently smoking OR have quit w/in 15years.) does qualify.   Lung Cancer Screening Referral: ordered 03/22/2022  Additional Screening:  Hepatitis C Screening: does not qualify; Completed 03/22/20  Vision Screening: Recommended annual ophthalmology exams for early detection of glaucoma and other disorders of the eye. Is the patient up to date with their annual eye exam?  Yes  Who is the provider or what is the name of the office in which the patient attends annual eye exams[facility] If pt is not established with a provider, would they like to be referred to a provider to establish care? No .   Dental Screening: Recommended annual dental exams for proper oral hygiene  Community Resource Referral / Chronic Care Management: CRR required this visit?  No   CCM required this visit?  No      Plan:     I have personally reviewed and noted the following in the patient's chart:   Medical and social history Use of alcohol, tobacco or illicit drugs  Current medications and supplements including opioid prescriptions. Patient is not currently taking opioid prescriptions. Functional ability and status Nutritional status Physical activity Advanced directives List of other  physicians Hospitalizations, surgeries, and ER visits in previous 12 months Vitals Screenings to include cognitive, depression, and falls Referrals and appointments  In addition, I have reviewed and discussed with patient certain preventive protocols, quality metrics, and best practice recommendations. A written personalized care plan for preventive services as well as general preventive health recommendations were provided to patient.     CLebron Conners LPN   [id number]  Nurse Notes: Pt declined colonoscopy. Vaccinations: declines all Influenza vaccine: recommend every Fall Pneumococcal vaccine: recommend once per lifetime Prevnar-20 Tdap vaccine: recommend every 10 years Shingles vaccine: recommend Shingrix which is 2 doses 2-6 months apart and over 90% effective     Covid-19: recommend 2 doses one month apart with a booster 6 months later

## 2022-03-28 NOTE — Patient Instructions (Signed)
Mr. Walter Maddox , Thank you for taking time to come for your Medicare Wellness Visit. I appreciate your ongoing commitment to your health goals. Please review the following plan we discussed and let me know if I can assist you in the future.   These are the goals we discussed:  Goals      Patient Stated     No new goals.        This is a list of the screening recommended for you and due dates:  Health Maintenance  Topic Date Due   COVID-19 Vaccine (1) Never done   DTaP/Tdap/Td vaccine (1 - Tdap) Never done   Zoster (Shingles) Vaccine (1 of 2) Never done   Colon Cancer Screening  11/13/2015   Medicare Annual Wellness Visit  03/22/2021   Flu Shot  Never done   Pneumonia Vaccine (1 of 2 - PCV) 07/31/2022*   Screening for Lung Cancer  03/19/2023   Hepatitis C Screening: USPSTF Recommendation to screen - Ages 18-79 yo.  Completed   HPV Vaccine  Aged Out  *Topic was postponed. The date shown is not the original due date.    Advanced directives: Advance directive discussed with you today. Even though you declined this today, please call our office should you change your mind, and we can give you the proper paperwork for you to fill out.   Conditions/risks identified: Aim for 30 minutes of exercise or brisk walking, 6-8 glasses of water, and 5 servings of fruits and vegetables each day.   If you wish to quit smoking, help is available. For free tobacco cessation program offerings call the [facility] at 951-565-3506 or Live Well Line at (463) 259-5815. You may also visit www.[place].com or email [email or web address]@[email or web address]'$ .[email or web address] for more information on other programs.   You may also call 1-800-QUIT-NOW 5046996670) or visit www.VirusCrisis.dk or www.BecomeAnEx.org for additional resources on smoking cessation.    Next appointment: Follow up in one year for your annual wellness visit. 04/02/23 @ 9:15 via telephone  Preventive Care 65 Years and Older, Male  Preventive care  refers to lifestyle choices and visits with your health care provider that can promote health and wellness. What does preventive care include? A yearly physical exam. This is also called an annual well check. Dental exams once or twice a year. Routine eye exams. Ask your health care provider how often you should have your eyes checked. Personal lifestyle choices, including: Daily care of your teeth and gums. Regular physical activity. Eating a healthy diet. Avoiding tobacco and drug use. Limiting alcohol use. Practicing safe sex. Taking low doses of aspirin every day. Taking vitamin and mineral supplements as recommended by your health care provider. What happens during an annual well check? The services and screenings done by your health care provider during your annual well check will depend on your age, overall health, lifestyle risk factors, and family history of disease. Counseling  Your health care provider may ask you questions about your: Alcohol use. Tobacco use. Drug use. Emotional well-being. Home and relationship well-being. Sexual activity. Eating habits. History of falls. Memory and ability to understand (cognition). Work and work Statistician. Screening  You may have the following tests or measurements: Height, weight, and BMI. Blood pressure. Lipid and cholesterol levels. These may be checked every 5 years, or more frequently if you are over 67 years old. Skin check. Lung cancer screening. You may have this screening every year starting at age 67 if you have a 30-pack-year history  of smoking and currently smoke or have quit within the past 15 years. Fecal occult blood test (FOBT) of the stool. You may have this test every year starting at age 67. Flexible sigmoidoscopy or colonoscopy. You may have a sigmoidoscopy every 5 years or a colonoscopy every 10 years starting at age 67. Prostate cancer screening. Recommendations will vary depending on your family history  and other risks. Hepatitis C blood test. Hepatitis B blood test. Sexually transmitted disease (STD) testing. Diabetes screening. This is done by checking your blood sugar (glucose) after you have not eaten for a while (fasting). You may have this done every 1-3 years. Abdominal aortic aneurysm (AAA) screening. You may need this if you are a current or former smoker. Osteoporosis. You may be screened starting at age 67 if you are at high risk. Talk with your health care provider about your test results, treatment options, and if necessary, the need for more tests. Vaccines  Your health care provider may recommend certain vaccines, such as: Influenza vaccine. This is recommended every year. Tetanus, diphtheria, and acellular pertussis (Tdap, Td) vaccine. You may need a Td booster every 10 years. Zoster vaccine. You may need this after age 71. Pneumococcal 13-valent conjugate (PCV13) vaccine. One dose is recommended after age 67. Pneumococcal polysaccharide (PPSV23) vaccine. One dose is recommended after age 67. Talk to your health care provider about which screenings and vaccines you need and how often you need them. This information is not intended to replace advice given to you by your health care provider. Make sure you discuss any questions you have with your health care provider. Document Released: 01/28/2015 Document Revised: 09/21/2015 Document Reviewed: 11/02/2014 Elsevier Interactive Patient Education  2017 [place] Prevention in the Home Falls can cause injuries. They can happen to people of all ages. There are many things you can do to make your home safe and to help prevent falls. What can I do on the outside of my home? Regularly fix the edges of walkways and driveways and fix any cracks. Remove anything that might make you trip as you walk through a door, such as a raised step or threshold. Trim any bushes or trees on the path to your home. Use bright outdoor  lighting. Clear any walking paths of anything that might make someone trip, such as rocks or tools. Regularly check to see if handrails are loose or broken. Make sure that both sides of any steps have handrails. Any raised decks and porches should have guardrails on the edges. Have any leaves, snow, or ice cleared regularly. Use sand or salt on walking paths during winter. Clean up any spills in your garage right away. This includes oil or grease spills. What can I do in the bathroom? Use night lights. Install grab bars by the toilet and in the tub and shower. Do not use towel bars as grab bars. Use non-skid mats or decals in the tub or shower. If you need to sit down in the shower, use a plastic, non-slip stool. Keep the floor dry. Clean up any water that spills on the floor as soon as it happens. Remove soap buildup in the tub or shower regularly. Attach bath mats securely with double-sided non-slip rug tape. Do not have throw rugs and other things on the floor that can make you trip. What can I do in the bedroom? Use night lights. Make sure that you have a light by your bed that is easy to reach. Do not  use any sheets or blankets that are too big for your bed. They should not hang down onto the floor. Have a firm chair that has side arms. You can use this for support while you get dressed. Do not have throw rugs and other things on the floor that can make you trip. What can I do in the kitchen? Clean up any spills right away. Avoid walking on wet floors. Keep items that you use a lot in easy-to-reach places. If you need to reach something above you, use a strong step stool that has a grab bar. Keep electrical cords out of the way. Do not use floor polish or wax that makes floors slippery. If you must use wax, use non-skid floor wax. Do not have throw rugs and other things on the floor that can make you trip. What can I do with my stairs? Do not leave any items on the stairs. Make  sure that there are handrails on both sides of the stairs and use them. Fix handrails that are broken or loose. Make sure that handrails are as long as the stairways. Check any carpeting to make sure that it is firmly attached to the stairs. Fix any carpet that is loose or worn. Avoid having throw rugs at the top or bottom of the stairs. If you do have throw rugs, attach them to the floor with carpet tape. Make sure that you have a light switch at the top of the stairs and the bottom of the stairs. If you do not have them, ask someone to add them for you. What else can I do to help prevent falls? Wear shoes that: Do not have high heels. Have rubber bottoms. Are comfortable and fit you well. Are closed at the toe. Do not wear sandals. If you use a stepladder: Make sure that it is fully opened. Do not climb a closed stepladder. Make sure that both sides of the stepladder are locked into place. Ask someone to hold it for you, if possible. Clearly mark and make sure that you can see: Any grab bars or handrails. First and last steps. Where the edge of each step is. Use tools that help you move around (mobility aids) if they are needed. These include: Canes. Walkers. Scooters. Crutches. Turn on the lights when you go into a dark area. Replace any light bulbs as soon as they burn out. Set up your furniture so you have a clear path. Avoid moving your furniture around. If any of your floors are uneven, fix them. If there are any pets around you, be aware of where they are. Review your medicines with your doctor. Some medicines can make you feel dizzy. This can increase your chance of falling. Ask your doctor what other things that you can do to help prevent falls. This information is not intended to replace advice given to you by your health care provider. Make sure you discuss any questions you have with your health care provider. Document Released: 10/28/2008 Document Revised: 06/09/2015  Document Reviewed: 02/05/2014 Elsevier Interactive Patient Education  2017 [employer].

## 2022-04-19 ENCOUNTER — Ambulatory Visit (INDEPENDENT_AMBULATORY_CARE_PROVIDER_SITE_OTHER): Payer: Medicare Other

## 2022-04-19 DIAGNOSIS — Z7901 Long term (current) use of anticoagulants: Secondary | ICD-10-CM

## 2022-04-19 LAB — POCT INR: INR: 2.5 (ref 2.0–3.0)

## 2022-04-19 NOTE — Patient Instructions (Addendum)
Pre visit review using our clinic review tool, if applicable. No additional management support is needed unless otherwise documented below in the visit note.  Continue 1 tablet daily.  Re-check in 6 weeks. 

## 2022-04-19 NOTE — Progress Notes (Signed)
Continue 1 tablet daily. Recheck in 6 weeks. 

## 2022-05-18 ENCOUNTER — Other Ambulatory Visit: Payer: Self-pay | Admitting: Family

## 2022-05-18 DIAGNOSIS — Z7901 Long term (current) use of anticoagulants: Secondary | ICD-10-CM

## 2022-05-21 NOTE — Telephone Encounter (Signed)
Pt overdue for f/u in office. Was due 1/24.  Sending in 30 days warfarin however does need to make office visit prior to next refill

## 2022-05-21 NOTE — Telephone Encounter (Signed)
Spoke with the patient who states he does not need any warfarin. I advised he would need to schedule an appt with Tabitha before anymore refills. He did not schedule at this time.

## 2022-05-31 ENCOUNTER — Ambulatory Visit (INDEPENDENT_AMBULATORY_CARE_PROVIDER_SITE_OTHER): Payer: Medicare Other

## 2022-05-31 DIAGNOSIS — Z7901 Long term (current) use of anticoagulants: Secondary | ICD-10-CM

## 2022-05-31 LAB — POCT INR: INR: 3.6 — AB (ref 2.0–3.0)

## 2022-05-31 NOTE — Patient Instructions (Addendum)
Pre visit review using our clinic review tool, if applicable. No additional management support is needed unless otherwise documented below in the visit note.  Hold dose today and the change weekly dose to take 1 tablet daily except take 1/2 tablet on Wednesday. Recheck in 3 weeks.

## 2022-05-31 NOTE — Progress Notes (Signed)
Pt denies any changes. Hold dose today and the change weekly dose to take 1 tablet daily except take 1/2 tablet on Wednesday. Recheck in 3 weeks.

## 2022-06-06 ENCOUNTER — Other Ambulatory Visit: Payer: Self-pay | Admitting: Family

## 2022-06-06 DIAGNOSIS — Z7901 Long term (current) use of anticoagulants: Secondary | ICD-10-CM

## 2022-06-21 ENCOUNTER — Ambulatory Visit (INDEPENDENT_AMBULATORY_CARE_PROVIDER_SITE_OTHER): Payer: Medicare Other

## 2022-06-21 DIAGNOSIS — Z7901 Long term (current) use of anticoagulants: Secondary | ICD-10-CM | POA: Diagnosis not present

## 2022-06-21 LAB — POCT INR: INR: 2 (ref 2.0–3.0)

## 2022-06-21 MED ORDER — WARFARIN SODIUM 5 MG PO TABS: ORAL_TABLET | ORAL | 0 refills | Status: DC

## 2022-06-21 NOTE — Patient Instructions (Addendum)
Pre visit review using our clinic review tool, if applicable. No additional management support is needed unless otherwise documented below in the visit note.  Continue 1 tablet daily except take 1/2 tablet on Wednesday. Recheck in 6 weeks.

## 2022-06-21 NOTE — Progress Notes (Signed)
Continue 1 tablet daily except take 1/2 tablet on Wednesday. Recheck in 6 weeks.  Pt is overdue for apt with PCP. He is going to stop at the front desk this morning and make an apt. He reported he needs a refill of warfarin and his pharmacy requests a 90 day supply. Advised pt to make sure he keeps apt with PCP and refill with be sent. Pt verbalized understanding.

## 2022-07-22 ENCOUNTER — Other Ambulatory Visit: Payer: Self-pay | Admitting: Family

## 2022-07-22 DIAGNOSIS — I10 Essential (primary) hypertension: Secondary | ICD-10-CM

## 2022-08-02 ENCOUNTER — Ambulatory Visit (INDEPENDENT_AMBULATORY_CARE_PROVIDER_SITE_OTHER): Payer: Medicare Other

## 2022-08-02 DIAGNOSIS — Z7901 Long term (current) use of anticoagulants: Secondary | ICD-10-CM

## 2022-08-02 LAB — POCT INR: INR: 4.3 — AB (ref 2.0–3.0)

## 2022-08-02 NOTE — Patient Instructions (Addendum)
Pre visit review using our clinic review tool, if applicable. No additional management support is needed unless otherwise documented below in the visit note.  Hold dose today and decrease dose tomorrow to take 1/2 tablet and then change 1 tablet daily except take 1/2 tablet on Sundays and Wednesday. Recheck in 2 weeks.

## 2022-08-02 NOTE — Progress Notes (Signed)
Pt reports he took a full tablet last night and not the prescribed 1/2 tablet on Wednesday. He said he was confused and thought yesterday was Tuesday. Pt denies any other changes. INR is very elevated at 4.3. That is very elevated for taking an extra 1/2 tablet. Made changes to weekly dosing. Pt will return in 2 weeks. Pt denies any abnormal bruising or bleeding. Advised if any s/s to call 911 or go to the ER. Pt verbalized understanding.  Hold dose today and decrease dose tomorrow to take 1/2 tablet and then change 1 tablet daily except take 1/2 tablet on Sundays and Wednesday. Recheck in 2 weeks.

## 2022-08-09 ENCOUNTER — Encounter: Payer: Medicare Other | Admitting: Family

## 2022-08-15 ENCOUNTER — Other Ambulatory Visit: Payer: Self-pay | Admitting: Family

## 2022-08-15 DIAGNOSIS — Z7901 Long term (current) use of anticoagulants: Secondary | ICD-10-CM

## 2022-08-16 ENCOUNTER — Ambulatory Visit (INDEPENDENT_AMBULATORY_CARE_PROVIDER_SITE_OTHER): Payer: Medicare Other

## 2022-08-16 DIAGNOSIS — Z7901 Long term (current) use of anticoagulants: Secondary | ICD-10-CM

## 2022-08-16 LAB — POCT INR: INR: 3.2 — AB (ref 2.0–3.0)

## 2022-08-16 NOTE — Patient Instructions (Addendum)
Pre visit review using our clinic review tool, if applicable. No additional management support is needed unless otherwise documented below in the visit note.  Decrease dose today to take 1/2 tablet and then change 1 tablet daily except take 1/2 tablet on Monday, Wednesday and Friday. Recheck in 3 weeks.

## 2022-08-16 NOTE — Telephone Encounter (Signed)
Pt in today for INR check.  Noticed a refill request for warfarin from pt's pharmacy. Per pt he does not need refill.  Denied refill request.

## 2022-08-16 NOTE — Progress Notes (Signed)
Decrease dose today to take 1/2 tablet and then change 1 tablet daily except take 1/2 tablet on Monday, Wednesday and Friday. Recheck in 3 weeks.  Noticed a refill request for warfarin from pt's pharmacy. Per pt he does not need refill.  Denied refill request.

## 2022-08-28 ENCOUNTER — Ambulatory Visit (INDEPENDENT_AMBULATORY_CARE_PROVIDER_SITE_OTHER): Payer: Medicare Other | Admitting: Family

## 2022-08-28 ENCOUNTER — Telehealth: Payer: Self-pay

## 2022-08-28 VITALS — BP 124/76 | HR 99 | Temp 97.7°F | Ht 72.0 in | Wt 207.0 lb

## 2022-08-28 DIAGNOSIS — E7800 Pure hypercholesterolemia, unspecified: Secondary | ICD-10-CM | POA: Diagnosis not present

## 2022-08-28 DIAGNOSIS — Z86718 Personal history of other venous thrombosis and embolism: Secondary | ICD-10-CM | POA: Insufficient documentation

## 2022-08-28 DIAGNOSIS — D72828 Other elevated white blood cell count: Secondary | ICD-10-CM

## 2022-08-28 DIAGNOSIS — Z136 Encounter for screening for cardiovascular disorders: Secondary | ICD-10-CM | POA: Insufficient documentation

## 2022-08-28 DIAGNOSIS — Z86010 Personal history of colonic polyps: Secondary | ICD-10-CM | POA: Insufficient documentation

## 2022-08-28 DIAGNOSIS — R7303 Prediabetes: Secondary | ICD-10-CM | POA: Diagnosis not present

## 2022-08-28 DIAGNOSIS — Z0001 Encounter for general adult medical examination with abnormal findings: Secondary | ICD-10-CM | POA: Diagnosis not present

## 2022-08-28 DIAGNOSIS — Z2821 Immunization not carried out because of patient refusal: Secondary | ICD-10-CM | POA: Insufficient documentation

## 2022-08-28 DIAGNOSIS — I10 Essential (primary) hypertension: Secondary | ICD-10-CM

## 2022-08-28 DIAGNOSIS — Z720 Tobacco use: Secondary | ICD-10-CM | POA: Diagnosis not present

## 2022-08-28 DIAGNOSIS — R760 Raised antibody titer: Secondary | ICD-10-CM | POA: Diagnosis not present

## 2022-08-28 LAB — CBC
HCT: 51.5 % (ref 39.0–52.0)
Hemoglobin: 17.1 g/dL — ABNORMAL HIGH (ref 13.0–17.0)
MCHC: 33.2 g/dL (ref 30.0–36.0)
MCV: 97.1 fl (ref 78.0–100.0)
Platelets: 200 10*3/uL (ref 150.0–400.0)
RBC: 5.3 Mil/uL (ref 4.22–5.81)
RDW: 13.7 % (ref 11.5–15.5)
WBC: 8.8 10*3/uL (ref 4.0–10.5)

## 2022-08-28 LAB — COMPREHENSIVE METABOLIC PANEL
ALT: 25 U/L (ref 0–53)
AST: 20 U/L (ref 0–37)
Albumin: 4.4 g/dL (ref 3.5–5.2)
Alkaline Phosphatase: 75 U/L (ref 39–117)
BUN: 13 mg/dL (ref 6–23)
CO2: 31 mEq/L (ref 19–32)
Calcium: 9.9 mg/dL (ref 8.4–10.5)
Chloride: 98 mEq/L (ref 96–112)
Creatinine, Ser: 0.95 mg/dL (ref 0.40–1.50)
GFR: 82.79 mL/min (ref >60.00)
Glucose, Bld: 98 mg/dL (ref 70–99)
Potassium: 3.7 mEq/L (ref 3.5–5.1)
Sodium: 137 mEq/L (ref 135–145)
Total Bilirubin: 0.7 mg/dL (ref 0.2–1.2)
Total Protein: 7.5 g/dL (ref 6.0–8.3)

## 2022-08-28 LAB — HEMOGLOBIN A1C: Hgb A1c MFr Bld: 6 % (ref 4.6–6.5)

## 2022-08-28 NOTE — Assessment & Plan Note (Signed)
Pt refuses to f/u to hematology and declines repeat lab testing today

## 2022-08-28 NOTE — Telephone Encounter (Signed)
-----   Message from [place] sent at 08/28/2022  2:38 PM EDT ----- Please call pt to schedule ultrasound of his abdominal aorta.  Please give him the following information to get scheduled.    [facility] center off [address], [place] [address] Phone [phone]  8-5 pm

## 2022-08-28 NOTE — Telephone Encounter (Signed)
Pt given information to schedule is abdominal US.

## 2022-08-28 NOTE — Patient Instructions (Signed)
  Highly recommend tetanus vaccination.    Regards,   Mort Sawyers FNP-C

## 2022-08-28 NOTE — Progress Notes (Signed)
Subjective:  Patient ID: Walter Maddox, male    DOB: 1955-02-25  Age: 67 y.o. MRN: [id number]  Patient Care Team: Mort Sawyers, FNP as PCP - General (Family Medicine)   CC:  Chief Complaint  Patient presents with   Annual Exam    HPI ALIJAH Maddox is a 67 y.o. male who presents today for an annual physical exam. He reports consuming a general diet.  Does not have a regular exercise routine other than push mowing the yard  He generally feels well. He reports sleeping well. He does not have additional problems to discuss today.   Vision:Within last year Dental:Receives regular dental care STD:The patient denies history of sexually transmitted disease.  Lung Cancer Screening with low-dose Chest CT: utd  - Adults age 15-80 who are current cigarette smokers or quit within the last 15 years. Must have 20 pack year history.  AAA Screening: pt has never had this done.  - Men age 51-75 who have ever smoked  Colonoscopy: 11/12/2005 pt did not get last year.  At this time not really interested in colonoscopy and or cologuard.   Recent CT low dose screening of lung with new 5.9 mm nodule, due and scheduled for repeat CT scan in September.  Also did show aortic atherosclerosis and coronary artery calcifications.    Pt is without acute concerns.   HTN: doing well on lisinopril hydrochlorothiazide 10-12.5 mg   HLD: was taking atorvastatin 20 mg once daily however he doesn't like taking statins.  Lab Results  Component Value Date   CHOL 211 (H) 08/04/2021   HDL 38.10 (L) 08/04/2021   LDLCALC 135 (H) 08/04/2021   TRIG 192.0 (H) 08/04/2021   CHOLHDL 6 08/04/2021   H/o positive antiphos lupus antibody, pt has not seen hematology for f/u and also he does not want any further testing or repeat lab testing in regards to this at this time.   Advanced Directives Patient does have advanced directives . He does not have a copy in the electronic medical record.   DEPRESSION SCREENING     08/28/2022    8:59 AM 03/28/2022    9:03 AM 08/03/2021   10:35 AM 03/22/2020    8:29 AM 10/20/2018    9:40 AM 07/09/2017    8:21 AM 08/16/2016    2:47 PM  PHQ 2/9 Scores  PHQ - 2 Score 0 0 0 0 0 0 0  PHQ- 9 Score 0  0 0        ROS: Negative unless specifically indicated above in HPI.    Current Outpatient Medications:    lisinopril-hydrochlorothiazide (ZESTORETIC) 10-12.5 MG tablet, TAKE 1 TABLET BY MOUTH EVERY DAY, Disp: 90 tablet, Rfl: 3   warfarin (COUMADIN) 5 MG tablet, TAKE 1 TABLET BY MOUTH DAILY EXCEPT TAKE 1/2 TABLET ON WEDNESDAYS OR AS DIRECTED BY ANTICOAGULATION CLINIC, Disp: 90 tablet, Rfl: 0    Objective:    BP 124/76   Pulse 99   Temp 97.7 F (36.5 C) (Oral)   Ht 6' (1.829 m)   Wt 207 lb (93.9 kg)   SpO2 97%   BMI 28.07 kg/m   BP Readings from Last 3 Encounters:  08/28/22 124/76  08/03/21 134/74  03/22/20 128/80      Physical Exam Vitals reviewed.  Constitutional:      General: He is not in acute distress.    Appearance: Normal appearance. He is normal weight. He is not ill-appearing, toxic-appearing or diaphoretic.  HENT:  Head: Normocephalic.     Right Ear: Tympanic membrane normal.     Left Ear: Tympanic membrane normal.     Nose: Nose normal.     Mouth/Throat:     Mouth: Mucous membranes are moist.  Eyes:     Pupils: Pupils are equal, round, and reactive to light.  Cardiovascular:     Rate and Rhythm: Normal rate and regular rhythm.  Pulmonary:     Effort: Pulmonary effort is normal.     Breath sounds: Normal breath sounds.  Abdominal:     General: Abdomen is flat.     Tenderness: There is no abdominal tenderness.  Musculoskeletal:        General: Normal range of motion.  Skin:    General: Skin is warm.  Neurological:     General: No focal deficit present.     Mental Status: He is alert and oriented to person, place, and time. Mental status is at baseline.  Psychiatric:        Mood and Affect: Mood normal.        Behavior: Behavior  normal.        Thought Content: Thought content normal.        Judgment: Judgment normal.          Assessment & Plan:  High blood monocyte count -     CBC  Prediabetes Assessment & Plan: Pt advised of the following: Work on a diabetic diet, try to incorporate exercise at least 20-30 a day for 3 days a week or more.  A1c ordered today pending results.   Orders: -     Hemoglobin A1c -     Comprehensive metabolic panel  Encounter for general adult medical examination with abnormal findings Assessment & Plan: Patient Counseling(The following topics were reviewed):  Preventative care handout given to pt  Health maintenance and immunizations reviewed. Please refer to Health maintenance section. Pt advised on safe sex, wearing seatbelts in car, and proper nutrition labwork ordered today for annual Dental health: Discussed importance of regular tooth brushing, flossing, and dental visits. Declines colonoscopy and also cologuard, educated on both of these.     Screening for AAA (abdominal aortic aneurysm) -     US AORTA; Future  Pneumococcal vaccination declined  Essential hypertension -     Comprehensive metabolic panel  Pure hypercholesterolemia Assessment & Plan: Ordered lipid panel, pending results. Work on low cholesterol diet and exercise as tolerated   Orders: -     Comprehensive metabolic panel  Antiphospholipid antibody positive Assessment & Plan: Pt refuses to f/u to hematology and declines repeat lab testing today    Tobacco abuse Assessment & Plan: Smoking cessation instruction/counseling given:  counseled patient on the dangers of tobacco use, advised patient to stop smoking, and reviewed strategies to maximize success   Orders: -     US AORTA; Future  History of colon polyps  History of DVT of lower extremity      Follow-up: Return in about 1 year (around 08/28/2023) for f/u CPE.   Mort Sawyers, FNP

## 2022-08-28 NOTE — Assessment & Plan Note (Signed)
Smoking cessation instruction/counseling given:  counseled patient on the dangers of tobacco use, advised patient to stop smoking, and reviewed strategies to maximize success 

## 2022-08-28 NOTE — Assessment & Plan Note (Signed)
Patient Counseling(The following topics were reviewed):  Preventative care handout given to pt  Health maintenance and immunizations reviewed. Please refer to Health maintenance section. Pt advised on safe sex, wearing seatbelts in car, and proper nutrition labwork ordered today for annual Dental health: Discussed importance of regular tooth brushing, flossing, and dental visits. Declines colonoscopy and also cologuard, educated on both of these.

## 2022-08-28 NOTE — Assessment & Plan Note (Signed)
Pt advised of the following: Work on a diabetic diet, try to incorporate exercise at least 20-30 a day for 3 days a week or more.  A1c ordered today pending results.

## 2022-08-28 NOTE — Assessment & Plan Note (Signed)
Ordered lipid panel, pending results. Work on low cholesterol diet and exercise as tolerated  

## 2022-08-29 ENCOUNTER — Other Ambulatory Visit: Payer: Self-pay | Admitting: Family

## 2022-08-29 ENCOUNTER — Ambulatory Visit (INDEPENDENT_AMBULATORY_CARE_PROVIDER_SITE_OTHER): Payer: Medicare Other

## 2022-08-29 DIAGNOSIS — D582 Other hemoglobinopathies: Secondary | ICD-10-CM | POA: Diagnosis not present

## 2022-08-29 LAB — VITAMIN B12: Vitamin B-12: 494 pg/mL (ref 211–911)

## 2022-09-06 ENCOUNTER — Ambulatory Visit (INDEPENDENT_AMBULATORY_CARE_PROVIDER_SITE_OTHER): Payer: Medicare Other

## 2022-09-06 DIAGNOSIS — Z7901 Long term (current) use of anticoagulants: Secondary | ICD-10-CM | POA: Diagnosis not present

## 2022-09-06 LAB — POCT INR: INR: 2.5 (ref 2.0–3.0)

## 2022-09-06 NOTE — Patient Instructions (Addendum)
 Pre visit review using our clinic review tool, if applicable. No additional management support is needed unless otherwise documented below in the visit note.  Continue 1 tablet daily except take 1/2 tablet on Monday, Wednesday and Friday. Recheck in  5 weeks.

## 2022-09-06 NOTE — Progress Notes (Signed)
Continue 1 tablet daily except take 1/2 tablet on Monday, Wednesday and Friday. Recheck in 5 weeks.

## 2022-09-19 ENCOUNTER — Ambulatory Visit: Admission: RE | Admit: 2022-09-19 | Payer: Medicare Other | Source: Ambulatory Visit

## 2022-10-04 ENCOUNTER — Ambulatory Visit (INDEPENDENT_AMBULATORY_CARE_PROVIDER_SITE_OTHER): Payer: Medicare Other

## 2022-10-04 DIAGNOSIS — Z7901 Long term (current) use of anticoagulants: Secondary | ICD-10-CM

## 2022-10-04 LAB — POCT INR: INR: 2.3 (ref 2.0–3.0)

## 2022-10-04 NOTE — Patient Instructions (Addendum)
Pre visit review using our clinic review tool, if applicable. No additional management support is needed unless otherwise documented below in the visit note.  Continue 1 tablet daily except take 1/2 tablet on Monday, Wednesday and Friday. Recheck in 6 weeks.

## 2022-10-04 NOTE — Progress Notes (Signed)
Continue 1 tablet daily except take 1/2 tablet on Monday, Wednesday and Friday. Recheck in 6 weeks.

## 2022-10-29 ENCOUNTER — Telehealth: Payer: Self-pay | Admitting: Family

## 2022-10-29 NOTE — Telephone Encounter (Signed)
Mort Sawyers, FNP  P Dugal Pool It looks like pt has not repeat his CT of his lungs. It is ordered but not yet schedule, can we please remind pt to reschedule this as he is one month overdue. We need to make sure there is no growth on that lung nodule.  Also would he be willing to complete the cologuard we had discussed during our office visit? This will just make sure there are no precancerous cells in the colon, and less invasive than a colonoscopy. ------------------------------------------------------------------- Spoke with pt. He is aware that he is due for this CT and he has been given phone number to [facility]. He declined doing Cologuard.

## 2022-10-29 NOTE — Telephone Encounter (Signed)
Noted  

## 2022-11-15 ENCOUNTER — Ambulatory Visit (INDEPENDENT_AMBULATORY_CARE_PROVIDER_SITE_OTHER): Payer: Medicare Other

## 2022-11-15 DIAGNOSIS — Z7901 Long term (current) use of anticoagulants: Secondary | ICD-10-CM | POA: Diagnosis not present

## 2022-11-15 LAB — POCT INR: INR: 2.2 (ref 2.0–3.0)

## 2022-11-15 NOTE — Progress Notes (Signed)
Continue 1 tablet daily except take 1/2 tablet on Monday, Wednesday and Friday. Recheck in 6 weeks.

## 2022-11-15 NOTE — Patient Instructions (Addendum)
Pre visit review using our clinic review tool, if applicable. No additional management support is needed unless otherwise documented below in the visit note.  Continue 1 tablet daily except take 1/2 tablet on Monday, Wednesday and Friday. Recheck in 6 weeks.

## 2022-12-27 ENCOUNTER — Ambulatory Visit: Payer: Medicare Other

## 2022-12-27 DIAGNOSIS — Z7901 Long term (current) use of anticoagulants: Secondary | ICD-10-CM

## 2022-12-27 LAB — POCT INR: INR: 2.1 (ref 2.0–3.0)

## 2022-12-27 NOTE — Progress Notes (Signed)
Continue 1 tablet daily except take 1/2 tablet on Monday, Wednesday and Friday. Recheck in 6 weeks.

## 2022-12-27 NOTE — Patient Instructions (Addendum)
Pre visit review using our clinic review tool, if applicable. No additional management support is needed unless otherwise documented below in the visit note.  Continue 1 tablet daily except take 1/2 tablet on Monday, Wednesday and Friday. Recheck in 6 weeks.

## 2022-12-28 IMAGING — CT CT CHEST LUNG CANCER SCREENING LOW DOSE W/O CM
2 of 5 series · 15 of 40 positions shown, 18 images · non-contrast
Comparison: Low-dose lung cancer screening chest CT 10/15/2019.

CLINICAL DATA: 66-year-old male current smoker with 36 pack-year
history of smoking. Lung cancer screening examination.



[Series 3: lung 1.00 · axial · 0.73mm/px · z∈[-1250,-894]mm · 12 of 392 slices shown, 15 images]
[im 18/392  mediastinal]
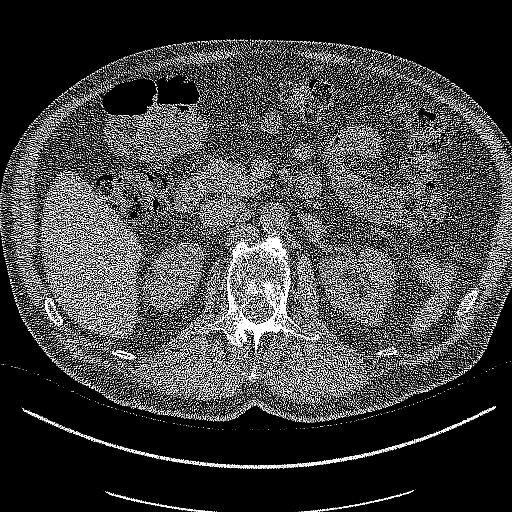
[im 18/392  lung]
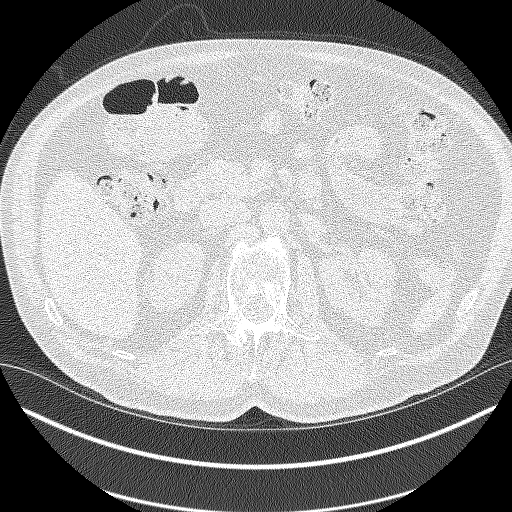
[im 72/392  lung]
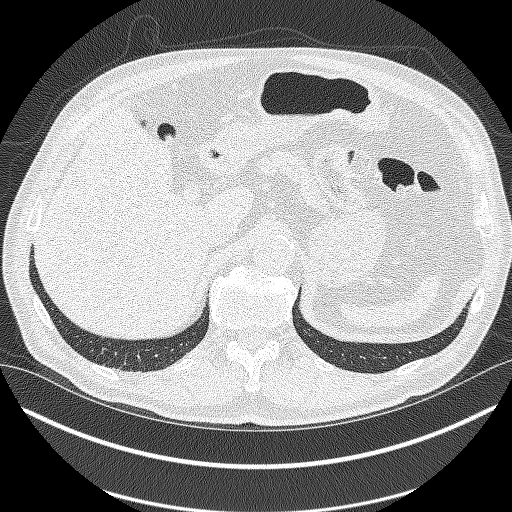
[im 107/392  lung]
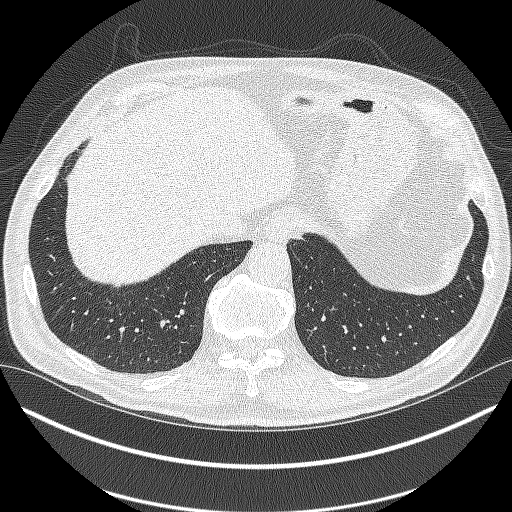
[im 125/392  lung]
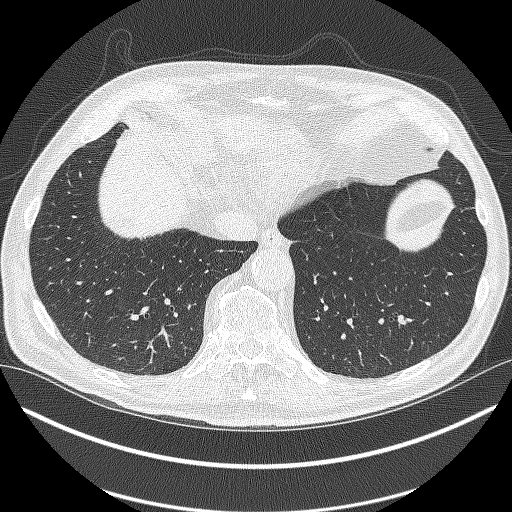
[im 160/392  mediastinal]
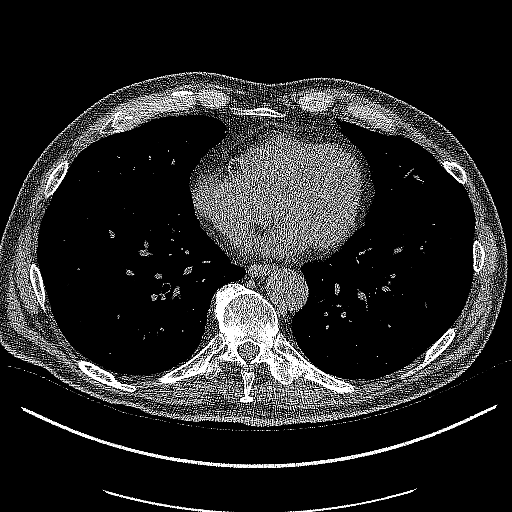
[im 160/392  lung]
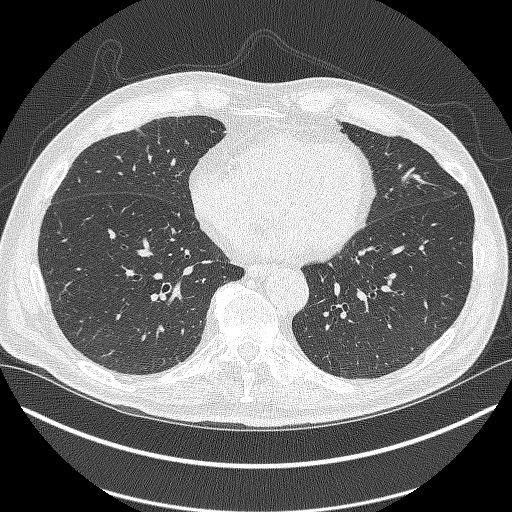
[im 196/392  lung]
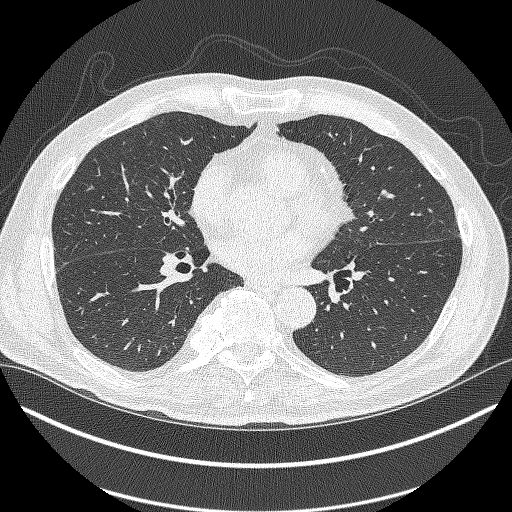
[im 214/392  lung]
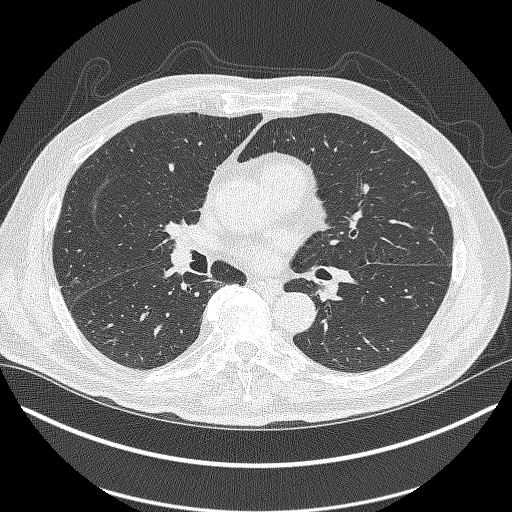
[im 249/392  lung]
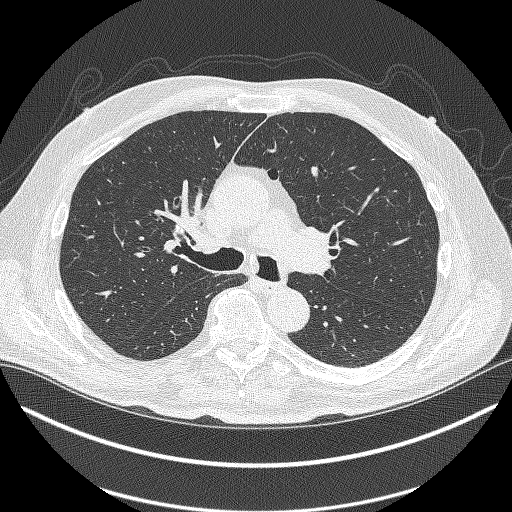
[im 285/392  mediastinal]
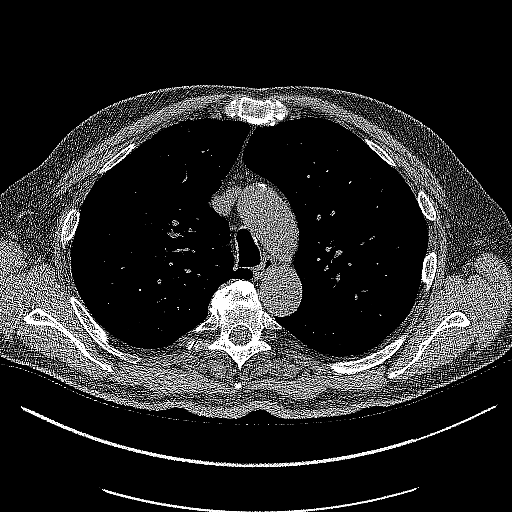
[im 285/392  lung]
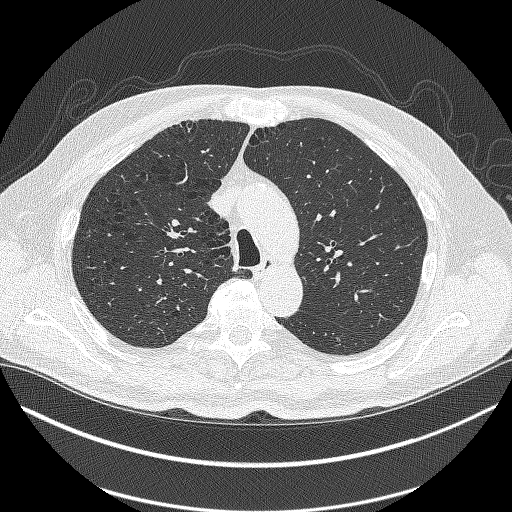
[im 303/392  lung]
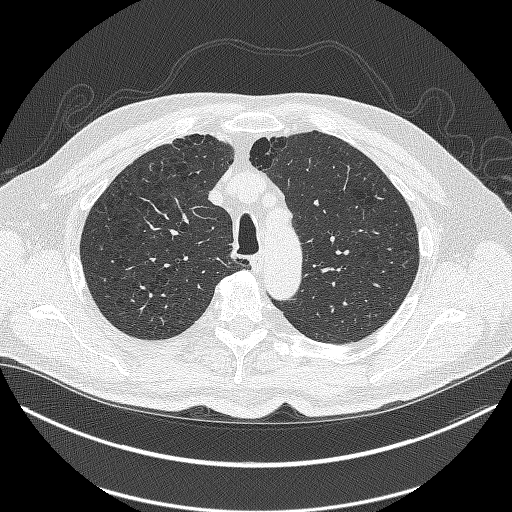
[im 338/392  lung]
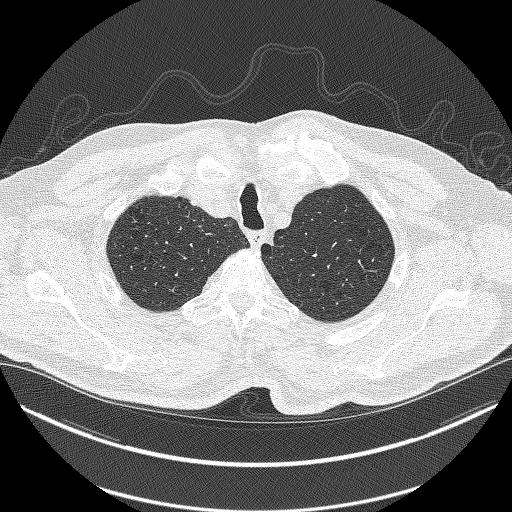
[im 374/392  lung]
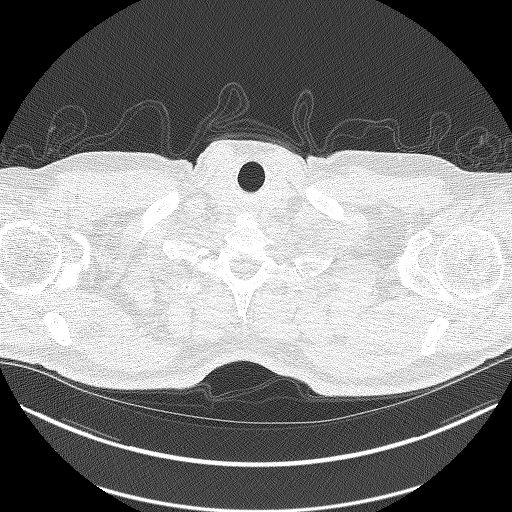

[Series 5: coronals lung 1.00 cor · coronal · 0.73mm/px · 3 of 287 slices shown]
[im 58/287  lung]
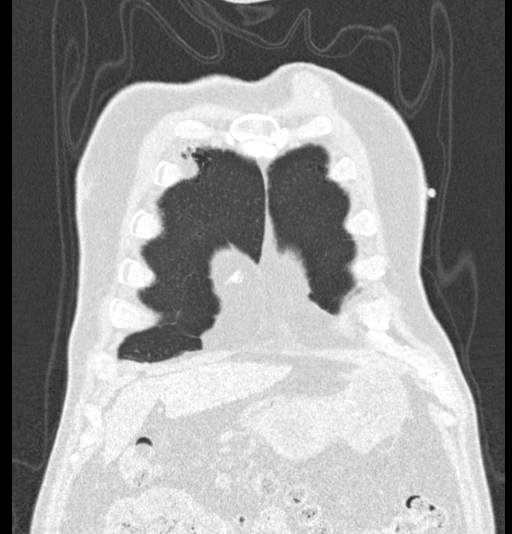
[im 115/287  lung]
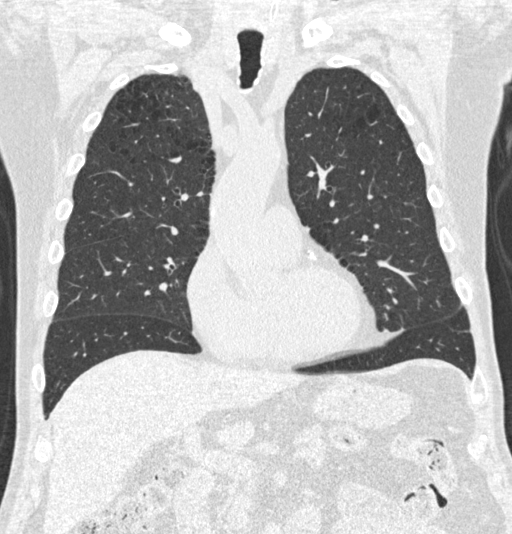
[im 172/287  lung]
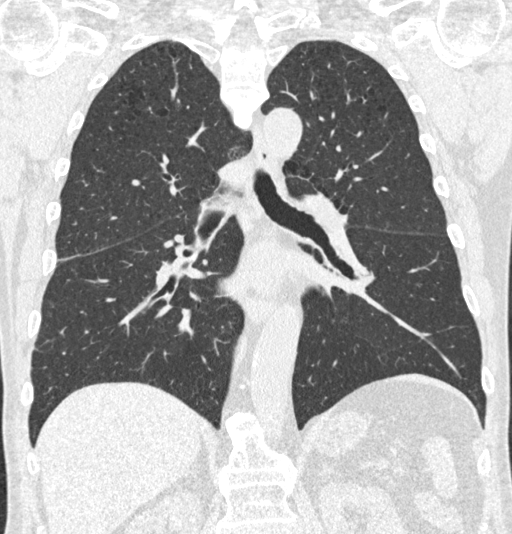

[15 of 40 positions shown; findings below may reference images not displayed]

FINDINGS: Cardiovascular: Heart size is normal. There is no significant
pericardial fluid, thickening or pericardial calcification. There is
aortic atherosclerosis, as well as atherosclerosis of the great
vessels of the mediastinum and the coronary arteries, including
calcified atherosclerotic plaque in the left main, left anterior
descending, left circumflex and right coronary arteries.

Mediastinum/Nodes: Mediastinal or no pathologically enlarged hilar
lymph nodes. Please note that accurate exclusion of hilar adenopathy
is limited on noncontrast CT scans. Esophagus is unremarkable in
appearance. No axillary lymphadenopathy.

Lungs/Pleura: Tiny pulmonary nodules are again noted in the lungs,
stable in size and number compared to the prior study, largest of
which is in the medial aspect of the right lower lobe (axial image
289 of series 3), with a volume derived mean diameter of 6.0 mm. No
larger more suspicious appearing pulmonary nodules or masses are
noted. No acute consolidative airspace disease. No pleural
effusions. Mild diffuse bronchial wall thickening with mild
centrilobular and paraseptal emphysema.

Upper Abdomen: Aortic atherosclerosis.

Musculoskeletal: There are no aggressive appearing lytic or blastic
lesions noted in the visualized portions of the skeleton.
IMPRESSION: 1. Lung-RADS 2S, benign appearance or behavior. Continue annual
screening with low-dose chest CT without contrast in 12 months.
2. The "S" modifier above refers to potentially clinically
significant non lung cancer related findings. Specifically, there is
aortic atherosclerosis, in addition to left main and three-vessel
coronary artery disease. Please note that although the presence of
coronary artery calcium documents the presence of coronary artery
disease, the severity of this disease and any potential stenosis
cannot be assessed on this non-gated CT examination. Assessment for
potential risk factor modification, dietary therapy or pharmacologic
therapy may be warranted, if clinically indicated.
3. Mild diffuse bronchial wall thickening with mild centrilobular
and paraseptal emphysema; imaging findings suggestive of underlying
COPD.

Aortic Atherosclerosis (29WB5-G6R.R) and Emphysema (29WB5-MJ7.Q).

## 2023-02-07 ENCOUNTER — Ambulatory Visit: Payer: Medicare Other

## 2023-02-07 DIAGNOSIS — Z7901 Long term (current) use of anticoagulants: Secondary | ICD-10-CM | POA: Diagnosis not present

## 2023-02-07 LAB — POCT INR: INR: 1.7 — AB (ref 2.0–3.0)

## 2023-02-07 NOTE — Patient Instructions (Addendum)
Pre visit review using our clinic review tool, if applicable. No additional management support is needed unless otherwise documented below in the visit note.  Increase dose today to take 1 1/2 tablets and then continue 1 tablet daily except take 1/2 tablet on Monday, Wednesday and Friday. Recheck in 4 weeks.

## 2023-02-07 NOTE — Progress Notes (Signed)
Increase dose today to take 1 1/2 tablets and then continue 1 tablet daily except take 1/2 tablet on Monday, Wednesday and Friday. Recheck in 4 weeks.

## 2023-03-07 ENCOUNTER — Ambulatory Visit: Payer: Medicare Other

## 2023-03-14 ENCOUNTER — Ambulatory Visit (INDEPENDENT_AMBULATORY_CARE_PROVIDER_SITE_OTHER): Payer: Medicare Other

## 2023-03-14 DIAGNOSIS — Z7901 Long term (current) use of anticoagulants: Secondary | ICD-10-CM | POA: Diagnosis not present

## 2023-03-14 LAB — POCT INR: INR: 2.5 (ref 2.0–3.0)

## 2023-03-14 NOTE — Progress Notes (Signed)
Continue 1 tablet daily except take 1/2 tablet on Monday, Wednesday and Friday. Recheck in 6 weeks.

## 2023-03-14 NOTE — Patient Instructions (Addendum)
 Pre visit review using our clinic review tool, if applicable. No additional management support is needed unless otherwise documented below in the visit note.  Continue 1 tablet daily except take 1/2 tablet on Monday, Wednesday and Friday. Recheck in  6 weeks.

## 2023-04-02 ENCOUNTER — Ambulatory Visit (INDEPENDENT_AMBULATORY_CARE_PROVIDER_SITE_OTHER): Payer: Medicare Other

## 2023-04-02 VITALS — Ht 72.0 in | Wt 207.0 lb

## 2023-04-02 DIAGNOSIS — Z0000 Encounter for general adult medical examination without abnormal findings: Secondary | ICD-10-CM | POA: Diagnosis not present

## 2023-04-02 NOTE — Progress Notes (Signed)
 Subjective:   Walter Maddox is a 68 y.o. who presents for a Medicare Wellness preventive visit.  Visit Complete: Virtual I connected with  Walter Maddox on 04/02/23 by a audio enabled telemedicine application and verified that I am speaking with the correct person using two identifiers.  Patient Location: Home  Provider Location: Home Office  I discussed the limitations of evaluation and management by telemedicine. The patient expressed understanding and agreed to proceed.  Vital Signs: Because this visit was a virtual/telehealth visit, some criteria may be missing or patient reported. Any vitals not documented were not able to be obtained and vitals that have been documented are patient reported.  VideoDeclined- This patient declined Librarian, academic. Therefore the visit was completed with audio only.  Persons Participating in Visit: Patient.  AWV Questionnaire: No: Patient Medicare AWV questionnaire was not completed prior to this visit.  Cardiac Risk Factors include: advanced age (>35men, >64 women);hypertension     Objective:    Today's Vitals   04/02/23 1106  Weight: 207 lb (93.9 kg)  Height: 6' (1.829 m)   Body mass index is 28.07 kg/m.     04/02/2023   11:15 AM 03/28/2022    9:04 AM 09/18/2016   10:03 AM 09/03/2016    2:31 PM  Advanced Directives  Does Patient Have a Medical Advance Directive? Yes No No No  Type of Estate agent of [place];Living will     Copy of Healthcare Power of Attorney in Chart? No - copy requested     Would patient like information on creating a medical advance directive?  No - Patient declined      Current Medications (verified) Outpatient Encounter Medications as of 04/02/2023  Medication Sig   lisinopril-hydrochlorothiazide (ZESTORETIC) 10-12.5 MG tablet TAKE 1 TABLET BY MOUTH EVERY DAY   warfarin (COUMADIN) 5 MG tablet TAKE 1 TABLET BY MOUTH DAILY EXCEPT TAKE 1/2 TABLET ON  WEDNESDAYS OR AS DIRECTED BY ANTICOAGULATION CLINIC   No facility-administered encounter medications on file as of 04/02/2023.    Allergies (verified) Patient has no known allergies.   History: Past Medical History:  Diagnosis Date   Back pain    Chronic pain of right hip    Dupuytren's contracture of both hands    DVT of lower limb, acute (HCC) 08/17/2016   per pt left leg   Elbow pain, chronic    Hyperlipidemia    Hypertension    Severe flexion contractures of all joints 2009   bilateral hands some finger immobility per pt   Past Surgical History:  Procedure Laterality Date   NO PAST SURGERIES     Family History  Problem Relation Age of Onset   Heart disease Mother    Parkinson's disease Mother    Dementia Mother    Pulmonary embolism Mother    Diabetes Father    Hypercholesterolemia Sister    Hypercholesterolemia Sister    Breast cancer Sister    Social History   Socioeconomic History   Marital status: Divorced    Spouse name: Not on file   Number of children: 3   Years of education: Not on file   Highest education level: Not on file  Occupational History   Occupation: disabled    Comment: retired  Tobacco Use   Smoking status: Every Day    Current packs/day: 1.00    Average packs/day: 1 pack/day for 60.0 years (60.0 ttl pk-yrs)    Types: Cigarettes   Smokeless  tobacco: Never   Tobacco comments:    pt refd smoking cessation info  Vaping Use   Vaping status: Never Used  Substance and Sexual Activity   Alcohol use: Yes    Comment: beer socially   Drug use: No   Sexual activity: Not Currently  Other Topics Concern   Not on file  Social History Narrative   Not on file   Social Drivers of Health   Financial Resource Strain: Low Risk  (04/02/2023)   Overall Financial Resource Strain (CARDIA)    Difficulty of Paying Living Expenses: Not hard at all  Food Insecurity: No Food Insecurity (04/02/2023)   Hunger Vital Sign    Worried About Running Out of  Food in the Last Year: Never true    Ran Out of Food in the Last Year: Never true  Transportation Needs: No Transportation Needs (04/02/2023)   PRAPARE - Administrator, Civil Service (Medical): No    Lack of Transportation (Non-Medical): No  Physical Activity: Sufficiently Active (04/02/2023)   Exercise Vital Sign    Days of Exercise per Week: 5 days    Minutes of Exercise per Session: 30 min  Stress: No Stress Concern Present (04/02/2023)   [employer] of Occupational Health - Occupational Stress Questionnaire    Feeling of Stress : Not at all  Social Connections: Socially Isolated (04/02/2023)   Social Connection and Isolation Panel [NHANES]    Frequency of Communication with Friends and Family: More than three times a week    Frequency of Social Gatherings with Friends and Family: More than three times a week    Attends Religious Services: Never    Database administrator or Organizations: No    Attends Engineer, structural: Never    Marital Status: Divorced    Tobacco Counseling Ready to quit: Not Answered Counseling given: Not Answered Tobacco comments: pt refd smoking cessation info    Clinical Intake:  Pre-visit preparation completed: No  Pain : No/denies pain    BMI - recorded: 28.07 Nutritional Status: BMI 25 -29 Overweight Nutritional Risks: None Diabetes: No  How often do you need to have someone help you when you read instructions, pamphlets, or other written materials from your doctor or pharmacy?: 1 - Never  Interpreter Needed?: No  Comments: lives alone Information entered by :: B.Suhaas Agena,LPN   Activities of Daily Living     04/02/2023   11:16 AM  In your present state of health, do you have any difficulty performing the following activities:  Hearing? 1  Vision? 0  Difficulty concentrating or making decisions? 0  Walking or climbing stairs? 0  Dressing or bathing? 0  Doing errands, shopping? 0  Preparing Food and  eating ? N  Using the Toilet? N  In the past six months, have you accidently leaked urine? N  Do you have problems with loss of bowel control? N  Managing your Medications? N  Managing your Finances? N    Patient Care Team: Mort Sawyers, FNP as PCP - General (Family Medicine)  Indicate any recent Medical Services you may have received from other than [facility] providers in the past year (date may be approximate).     Assessment:   This is a routine wellness examination for Walter Maddox.  Hearing/Vision screen Hearing Screening - Comments:: Pt says his hearing is good Vision Screening - Comments:: Pt says his vision is good;readers only [facility] Addressed  This Visit's Progress    Patient Stated       04/02/23-No new goals.       Depression Screen     04/02/2023   11:12 AM 08/28/2022    8:59 AM 03/28/2022    9:03 AM 08/03/2021   10:35 AM 03/22/2020    8:29 AM 10/20/2018    9:40 AM 07/09/2017    8:21 AM  PHQ 2/9 Scores  PHQ - 2 Score 0 0 0 0 0 0 0  PHQ- 9 Score  0  0 0      Fall Risk     04/02/2023   11:09 AM 03/28/2022    9:01 AM 08/03/2021    9:17 AM 03/22/2020    8:29 AM 08/16/2016    2:47 PM  Fall Risk   Falls in the past year? 0 0 0 0 No  Number falls in past yr: 0 0 0    Injury with Fall? 0 0 0    Risk for fall due to : No Fall Risks No Fall Risks No Fall Risks    Follow up Education provided;Falls prevention discussed Falls prevention discussed;Falls evaluation completed       MEDICARE RISK AT HOME:  Medicare Risk at Home Any stairs in or around the home?: No If so, are there any without handrails?: No Home free of loose throw rugs in walkways, pet beds, electrical cords, etc?: Yes Adequate lighting in your home to reduce risk of falls?: Yes Life alert?: No Use of a cane, walker or w/c?: No Grab bars in the bathroom?: No Shower chair or bench in shower?: No Elevated toilet seat or a handicapped toilet?: Yes  TIMED UP AND  GO:  Was the test performed?  No  Cognitive Function: 6CIT completed        04/02/2023   11:17 AM 03/28/2022    9:05 AM  6CIT Screen  What Year? 0 points 0 points  What month? 0 points 0 points  What time? 0 points 0 points  Count back from 20 0 points 0 points  Months in reverse 4 points 0 points  Repeat phrase 6 points 0 points  Total Score 10 points 0 points    Immunizations  There is no immunization history on file for this patient.  Screening Tests Health Maintenance  Topic Date Due   Zoster Vaccines- Shingrix (1 of 2) Never done   INFLUENZA VACCINE  Never done   COVID-19 Vaccine (1 - 2024-25 season) Never done   Lung Cancer Screening  03/19/2023   Pneumonia Vaccine 43+ Years old (1 of 2 - PCV) 08/28/2023 (Originally 02/01/1961)   Colonoscopy  08/28/2023 (Originally 11/13/2015)   Medicare Annual Wellness (AWV)  04/01/2024   Hepatitis C Screening  Completed   HPV VACCINES  Aged Out   DTaP/Tdap/Td  Discontinued    Health Maintenance  Health Maintenance Due  Topic Date Due   Zoster Vaccines- Shingrix (1 of 2) Never done   INFLUENZA VACCINE  Never done   COVID-19 Vaccine (1 - 2024-25 season) Never done   Lung Cancer Screening  03/19/2023   Health Maintenance Items Addressed: .aw  Additional Screening:  Vision Screening: Recommended annual ophthalmology exams for early detection of glaucoma and other disorders of the eye.  Dental Screening: Recommended annual dental exams for proper oral hygiene  Community Resource Referral / Chronic Care Management: CRR required this visit?  No   CCM required this visit?  No pt declined..will schedule for August PE later  Plan:     I have personally reviewed and noted the following in the patient's chart:   Medical and social history Use of alcohol, tobacco or illicit drugs  Current medications and supplements including opioid prescriptions. Patient is not currently taking opioid prescriptions. Functional  ability and status Nutritional status Physical activity Advanced directives List of other physicians Hospitalizations, surgeries, and ER visits in previous 12 months Vitals Screenings to include cognitive, depression, and falls Referrals and appointments  In addition, I have reviewed and discussed with patient certain preventive protocols, quality metrics, and best practice recommendations. A written personalized care plan for preventive services as well as general preventive health recommendations were provided to patient.    Sue Lush, LPN   07/14/5282   After Visit Summary: (Declined) Due to this being a telephonic visit, with patients personalized plan was offered to patient but patient Declined AVS at this time   Notes: Nothing significant to report at this time.  Vaccinations: declines all: Influenza vaccine: recommend every Fall Pneumococcal vaccine: recommend once per lifetime Prevnar-20 Tdap vaccine: recommend every 10 years Shingles vaccine: recommend Shingrix which is 2 doses 2-6 months apart and over 90% effective     Covid-19: recommend 2 doses one month apart with a booster 6 months later   Colorectal Cancer Screening: Patient declined colorectal cancer screening

## 2023-04-02 NOTE — Patient Instructions (Signed)
 Mr. Walter Maddox , Thank you for taking time to come for your Medicare Wellness Visit. I appreciate your ongoing commitment to your health goals. Please review the following plan we discussed and let me know if I can assist you in the future.   Referrals/Orders/Follow-Ups/Clinician Recommendations: none  This is a list of the screening recommended for you and due dates:  Health Maintenance  Topic Date Due   Zoster (Shingles) Vaccine (1 of 2) Never done   Flu Shot  Never done   COVID-19 Vaccine (1 - 2024-25 season) Never done   Screening for Lung Cancer  03/19/2023   Pneumonia Vaccine (1 of 2 - PCV) 08/28/2023*   Colon Cancer Screening  08/28/2023*   Medicare Annual Wellness Visit  04/01/2024   Hepatitis C Screening  Completed   HPV Vaccine  Aged Out   DTaP/Tdap/Td vaccine  Discontinued  *Topic was postponed. The date shown is not the original due date.    Advanced directives: (Copy Requested) Please bring a copy of your health care power of attorney and living will to the office to be added to your chart at your convenience. You can mail to [facility] [address] [place], [place] [address] or email to [email or web address]  Next Medicare Annual Wellness Visit scheduled for next year: Yes 04/02/2024 @ 10:10am televisit

## 2023-04-25 ENCOUNTER — Ambulatory Visit: Payer: Medicare Other

## 2023-04-25 DIAGNOSIS — Z7901 Long term (current) use of anticoagulants: Secondary | ICD-10-CM

## 2023-04-25 LAB — POCT INR: INR: 2.6 (ref 2.0–3.0)

## 2023-04-25 NOTE — Patient Instructions (Addendum)
 Pre visit review using our clinic review tool, if applicable. No additional management support is needed unless otherwise documented below in the visit note.  Continue 1 tablet daily except take 1/2 tablet on Monday, Wednesday and Friday. Recheck in  6 weeks.

## 2023-04-25 NOTE — Progress Notes (Signed)
Continue 1 tablet daily except take 1/2 tablet on Monday, Wednesday and Friday. Recheck in 6 weeks.

## 2023-06-06 ENCOUNTER — Ambulatory Visit (INDEPENDENT_AMBULATORY_CARE_PROVIDER_SITE_OTHER)

## 2023-06-06 DIAGNOSIS — Z7901 Long term (current) use of anticoagulants: Secondary | ICD-10-CM | POA: Diagnosis not present

## 2023-06-06 LAB — POCT INR: INR: 2.4 (ref 2.0–3.0)

## 2023-06-06 NOTE — Patient Instructions (Addendum)
 Pre visit review using our clinic review tool, if applicable. No additional management support is needed unless otherwise documented below in the visit note.  Continue 1 tablet daily except take 1/2 tablet on Monday, Wednesday and Friday. Recheck in 7 weeks.

## 2023-06-06 NOTE — Progress Notes (Signed)
 Continue 1 tablet daily except take 1/2 tablet on Monday, Wednesday and Friday. Recheck in 7 weeks.

## 2023-07-09 ENCOUNTER — Other Ambulatory Visit: Payer: Self-pay | Admitting: Family

## 2023-07-09 DIAGNOSIS — I10 Essential (primary) hypertension: Secondary | ICD-10-CM

## 2023-07-25 ENCOUNTER — Ambulatory Visit

## 2023-07-25 DIAGNOSIS — Z7901 Long term (current) use of anticoagulants: Secondary | ICD-10-CM

## 2023-07-25 LAB — POCT INR: INR: 3.1 — AB (ref 2.0–3.0)

## 2023-07-25 NOTE — Patient Instructions (Addendum)
 Pre visit review using our clinic review tool, if applicable. No additional management support is needed unless otherwise documented below in the visit note.  Reduce dose today to take 1/2 tablet and the continue 1 tablet daily except take 1/2 tablet on Monday, Wednesday and Friday. Recheck in 4 weeks.

## 2023-07-25 NOTE — Progress Notes (Signed)
 Reduce dose today to take 1/2 tablet and the continue 1 tablet daily except take 1/2 tablet on Monday, Wednesday and Friday. Recheck in 4 weeks.  Advised pt he should schedule his annual physical with PCP. He requested to schedule. Next opening was not until Oct. Scheduled CPE for Oct with PCP.

## 2023-08-09 ENCOUNTER — Telehealth: Payer: Self-pay

## 2023-08-09 NOTE — Telephone Encounter (Signed)
 Pt LVM reporting he will be out of town during the time he has next coumadin  clinic apt. Requested to move it back one week.  LVM that apt has been moved back.

## 2023-08-22 ENCOUNTER — Ambulatory Visit

## 2023-08-29 ENCOUNTER — Ambulatory Visit (INDEPENDENT_AMBULATORY_CARE_PROVIDER_SITE_OTHER)

## 2023-08-29 DIAGNOSIS — Z7901 Long term (current) use of anticoagulants: Secondary | ICD-10-CM | POA: Diagnosis not present

## 2023-08-29 LAB — POCT INR: INR: 2.5 (ref 2.0–3.0)

## 2023-08-29 NOTE — Patient Instructions (Addendum)
 Pre visit review using our clinic review tool, if applicable. No additional management support is needed unless otherwise documented below in the visit note.  Continue 1 tablet daily except take 1/2 tablet on Monday, Wednesday and Friday. Recheck in  6 weeks.

## 2023-08-29 NOTE — Progress Notes (Signed)
 Continue 1 tablet daily except take 1/2 tablet on Monday, Wednesday and Friday. Recheck in 6 weeks.

## 2023-10-10 ENCOUNTER — Ambulatory Visit (INDEPENDENT_AMBULATORY_CARE_PROVIDER_SITE_OTHER)

## 2023-10-10 DIAGNOSIS — Z7901 Long term (current) use of anticoagulants: Secondary | ICD-10-CM

## 2023-10-10 LAB — POCT INR: INR: 1.7 — AB (ref 2.0–3.0)

## 2023-10-10 NOTE — Progress Notes (Signed)
 Increase dose today to take 1 1/2 tablets and then continue 1 tablet daily except take 1/2 tablet on Monday, Wednesday and Friday. Recheck in 3 weeks.

## 2023-10-10 NOTE — Patient Instructions (Addendum)
 Pre visit review using our clinic review tool, if applicable. No additional management support is needed unless otherwise documented below in the visit note.  Increase dose today to take 1 1/2 tablets and then continue 1 tablet daily except take 1/2 tablet on Monday, Wednesday and Friday. Recheck in 3 weeks.

## 2023-10-31 ENCOUNTER — Ambulatory Visit

## 2023-10-31 DIAGNOSIS — Z7901 Long term (current) use of anticoagulants: Secondary | ICD-10-CM | POA: Diagnosis not present

## 2023-10-31 LAB — POCT INR: INR: 3.2 — AB (ref 2.0–3.0)

## 2023-10-31 NOTE — Patient Instructions (Addendum)
 Pre visit review using our clinic review tool, if applicable. No additional management support is needed unless otherwise documented below in the visit note.  Reduce dose today to take 1/2 tablet and then continue 1 tablet daily except take 1/2 tablet on Monday, Wednesday and Friday. Recheck in 3 weeks.

## 2023-10-31 NOTE — Progress Notes (Signed)
 Indication: APS, DVT Reduce dose today to take 1/2 tablet and then continue 1 tablet daily except take 1/2 tablet on Monday, Wednesday and Friday. Recheck in 3 weeks.

## 2023-11-05 ENCOUNTER — Ambulatory Visit: Admitting: Family

## 2023-11-05 ENCOUNTER — Telehealth: Payer: Self-pay | Admitting: Family

## 2023-11-05 ENCOUNTER — Encounter: Payer: Self-pay | Admitting: Family

## 2023-11-05 ENCOUNTER — Ambulatory Visit: Payer: Self-pay | Admitting: Family

## 2023-11-05 VITALS — BP 130/72 | HR 70 | Temp 98.1°F | Ht 72.0 in | Wt 222.0 lb

## 2023-11-05 DIAGNOSIS — R911 Solitary pulmonary nodule: Secondary | ICD-10-CM | POA: Diagnosis not present

## 2023-11-05 DIAGNOSIS — E7800 Pure hypercholesterolemia, unspecified: Secondary | ICD-10-CM

## 2023-11-05 DIAGNOSIS — I700 Atherosclerosis of aorta: Secondary | ICD-10-CM | POA: Diagnosis not present

## 2023-11-05 DIAGNOSIS — I10 Essential (primary) hypertension: Secondary | ICD-10-CM

## 2023-11-05 DIAGNOSIS — R0989 Other specified symptoms and signs involving the circulatory and respiratory systems: Secondary | ICD-10-CM | POA: Diagnosis not present

## 2023-11-05 DIAGNOSIS — I7143 Infrarenal abdominal aortic aneurysm, without rupture: Secondary | ICD-10-CM

## 2023-11-05 DIAGNOSIS — R7303 Prediabetes: Secondary | ICD-10-CM

## 2023-11-05 DIAGNOSIS — J439 Emphysema, unspecified: Secondary | ICD-10-CM | POA: Diagnosis not present

## 2023-11-05 DIAGNOSIS — Z7901 Long term (current) use of anticoagulants: Secondary | ICD-10-CM

## 2023-11-05 DIAGNOSIS — Z0000 Encounter for general adult medical examination without abnormal findings: Secondary | ICD-10-CM

## 2023-11-05 DIAGNOSIS — D582 Other hemoglobinopathies: Secondary | ICD-10-CM | POA: Diagnosis not present

## 2023-11-05 DIAGNOSIS — Z720 Tobacco use: Secondary | ICD-10-CM

## 2023-11-05 LAB — COMPREHENSIVE METABOLIC PANEL WITH GFR
ALT: 24 U/L (ref 0–53)
AST: 17 U/L (ref 0–37)
Albumin: 4.4 g/dL (ref 3.5–5.2)
Alkaline Phosphatase: 63 U/L (ref 39–117)
BUN: 10 mg/dL (ref 6–23)
CO2: 30 meq/L (ref 19–32)
Calcium: 9.5 mg/dL (ref 8.4–10.5)
Chloride: 97 meq/L (ref 96–112)
Creatinine, Ser: 0.9 mg/dL (ref 0.40–1.50)
GFR: 87.61 mL/min (ref >60.00)
Glucose, Bld: 116 mg/dL — ABNORMAL HIGH (ref 70–99)
Potassium: 3.6 meq/L (ref 3.5–5.1)
Sodium: 138 meq/L (ref 135–145)
Total Bilirubin: 0.5 mg/dL (ref 0.2–1.2)
Total Protein: 6.9 g/dL (ref 6.0–8.3)

## 2023-11-05 LAB — CBC
HCT: 49.1 % (ref 39.0–52.0)
Hemoglobin: 16.6 g/dL (ref 13.0–17.0)
MCHC: 33.8 g/dL (ref 30.0–36.0)
MCV: 95.2 fl (ref 78.0–100.0)
Platelets: 206 K/uL (ref 150.0–400.0)
RBC: 5.16 Mil/uL (ref 4.22–5.81)
RDW: 13 % (ref 11.5–15.5)
WBC: 7.6 K/uL (ref 4.0–10.5)

## 2023-11-05 LAB — LIPID PANEL
Cholesterol: 216 mg/dL — ABNORMAL HIGH (ref 0–200)
HDL: 41.5 mg/dL (ref >39.00)
LDL Cholesterol: 113 mg/dL — ABNORMAL HIGH (ref 0–99)
NonHDL: 174.92
Total CHOL/HDL Ratio: 5
Triglycerides: 310 mg/dL — ABNORMAL HIGH (ref 0.0–149.0)
VLDL: 62 mg/dL — ABNORMAL HIGH (ref 0.0–40.0)

## 2023-11-05 LAB — HEMOGLOBIN A1C: Hgb A1c MFr Bld: 5.9 % (ref 4.6–6.5)

## 2023-11-05 NOTE — Patient Instructions (Signed)
  I have ordered imaging for you at [facility] outpatient diagnostic center. This order has been sent over for you electronically.   Please call [phone] to schedule this appointment. This is for your abdominal aortic aneurysm screening   ------------------------------------

## 2023-11-05 NOTE — Progress Notes (Addendum)
 Subjective:  Patient ID: Walter Maddox, male    DOB: 1955/03/10  Age: 68 y.o. MRN: [id number]  Patient Care Team: Corwin Antu, FNP as PCP - General (Family Medicine)   CC:  Chief Complaint  Patient presents with   Annual Exam    HPI Walter Maddox is a 68 y.o. male who presents today for an annual physical exam. He reports consuming a general diet. The patient does not participate in regular exercise at present. He generally feels well. He reports sleeping well. He does not have additional problems to discuss today.   Vision:Within last year Dental:No regular dental care  Lung Cancer Screening with low-dose Chest CT: last in 04/2022 was due for repeat 09/2022 for 6 month f/u for new pulmonary nodule but has not scheduled - Adults age 60-80 who are current cigarette smokers or quit within the last 15 years. Must have 20 pack year history.  AAA Screening: agreeable to this, will order. - Men age 63-75 who have ever smoked  Colonoscopy:11/12/2005 every ten years overdue , declines both colonoscopy and or cologuard   Pt is without acute concerns.   Discussed the use of AI scribe software for clinical note transcription with the patient, who gave verbal consent to proceed.  History of Present Illness Walter Maddox is a 68 year old male who presents for follow-up and routine health maintenance.  He has been consistent with his follow-ups for the Coumadin  clinic. No dizziness, chest pain, or shortness of breath, although he notes limited physical activity during colder months.  He is aware of his prediabetic status, with an A1c consistently around 6.0. He uses a spoonful of sugar in his coffee but rarely consumes sodas.  A new left lower lobe pulmonary nodule was identified last year, which was under 8 mm. He missed the follow-up CT scan in September of last year.  He has a history of smoking and is at risk for an abdominal aortic aneurysm. He was scheduled for an abdominal  ultrasound last year but did not attend due to confusion about the location. He is open to rescheduling this test.  He has a history of deep vein thrombosis in the left lower extremity and is on Coumadin  for anticoagulation. He was previously on Xarelto  but switched due to cost. He attends regular monitoring appointments for his Coumadin .  He has a history of polyps found during a colonoscopy in 2007 and is aware of the need for regular screening. He is not interested in a repeat colonoscopy or alternative screening methods at this time.   Advanced Directives Patient does not have advanced directives, declines packet at this time  DEPRESSION SCREENING    11/05/2023    8:33 AM 04/02/2023   11:12 AM 08/28/2022    8:59 AM 03/28/2022    9:03 AM 08/03/2021   10:35 AM 03/22/2020    8:29 AM 10/20/2018    9:40 AM  PHQ 2/9 Scores  PHQ - 2 Score 0 0 0 0 0 0 0  PHQ- 9 Score 0  0  0 0      ROS: Negative unless specifically indicated above in HPI.    Current Outpatient Medications:    lisinopril -hydrochlorothiazide  (ZESTORETIC ) 10-12.5 MG tablet, TAKE 1 TABLET BY MOUTH EVERY DAY, Disp: 90 tablet, Rfl: 3   warfarin (COUMADIN ) 5 MG tablet, TAKE 1 TABLET BY MOUTH DAILY EXCEPT TAKE 1/2 TABLET ON WEDNESDAYS OR AS DIRECTED BY ANTICOAGULATION CLINIC, Disp: 90 tablet, Rfl: 0  Objective:    BP 130/72 (BP Location: Left Arm, Patient Position: Sitting, Cuff Size: Normal)   Pulse 70   Temp 98.1 F (36.7 C) (Temporal)   Ht 6' (1.829 m)   Wt 222 lb (100.7 kg)   SpO2 94%   BMI 30.11 kg/m   BP Readings from Last 3 Encounters:  11/05/23 130/72  08/28/22 124/76  08/03/21 134/74      Physical Exam Vitals reviewed.  Constitutional:      General: He is not in acute distress.    Appearance: Normal appearance. He is normal weight. He is not ill-appearing, toxic-appearing or diaphoretic.  HENT:     Head: Normocephalic.     Right Ear: Tympanic membrane normal.     Left Ear: Tympanic membrane normal.      Nose: Nose normal.     Mouth/Throat:     Mouth: Mucous membranes are moist.  Eyes:     Pupils: Pupils are equal, round, and reactive to light.  Cardiovascular:     Rate and Rhythm: Normal rate and regular rhythm.  Pulmonary:     Effort: Pulmonary effort is normal.     Breath sounds: Normal breath sounds.  Abdominal:     General: Abdomen is flat.     Tenderness: There is no abdominal tenderness.  Musculoskeletal:        General: Normal range of motion.  Skin:    General: Skin is warm.  Neurological:     General: No focal deficit present.     Mental Status: He is alert and oriented to person, place, and time. Mental status is at baseline.  Psychiatric:        Mood and Affect: Mood normal.        Behavior: Behavior normal.        Thought Content: Thought content normal.        Judgment: Judgment normal.       Results LABS HbA1c: 6.0  RADIOLOGY CT Chest: New left lower lobe pulmonary nodule under 8 mm      Assessment & Plan:   Assessment and Plan Assessment & Plan Adult Wellness Visit Routine adult wellness visit with well-controlled blood pressure at 130/72 mmHg. No chest pain, dizziness, or shortness of breath. Declined prostate antigen screening, pneumonia vaccine, and colonoscopy. Discussed Cologuard as an alternative with 94% reliability for detecting precancerous cells, but declined. Encouraged to consider advanced directives, expressed preference for pull the plug approach but not formalized. - Perform annual lab work including cholesterol, A1c, liver function, kidney function, and potassium levels. - Encourage dietary modifications to manage prediabetes, focusing on reducing sugar intake and increasing whole grains. - Encourage dental visit and eye exam. - Discuss advanced directives with family and consider formalizing preferences. -Patient Counseling(The following topics were reviewed):  Preventative care handout given to pt  Health maintenance and  immunizations reviewed. Please refer to Health maintenance section. Pt advised on safe sex, wearing seatbelts in car, and proper nutrition labwork ordered today for annual Dental health: Discussed importance of regular tooth brushing, flossing, and dental visits.  Left lower lobe pulmonary nodule, under surveillance Previously identified left lower lobe pulmonary nodule under 8 mm. Overdue for follow-up CT scan. - Coordinate follow-up CT scan for left lower lobe pulmonary nodule.  Essential hypertension Blood pressure is well-controlled with current medication regimen. No symptoms of dizziness or chest pain. - Continue current medication regimen of Lisinopril -hydrochlorothiazide  10-12.5 mg oral daily.  Prediabetes A1c around 6.0. Discussed dietary modifications to prevent progression  to diabetes. - Encourage dietary modifications focusing on reducing sugar intake and increasing whole grains.  Pure hypercholesterolemia Cholesterol levels to be monitored with annual lab work. - Include cholesterol levels in annual lab work.  History of deep vein thrombosis, lower extremity, on chronic anticoagulation and antiphospholipid antibody positive On chronic anticoagulation with Warfarin. Discussed potential switch to Xarelto  but decided to continue current regimen due to cost considerations. - Continue Warfarin 5 mg oral daily except 1/2 tablet on Wednesdays. - Monitor INR levels regularly.  Carotid artery bruit, under evaluation for carotid stenosis Presence of carotid bruit suggesting possible carotid stenosis. No symptoms of claudication or cold extremities. - Order carotid ultrasound to evaluate for stenosis.  Tobacco use Continued tobacco use. Discussed risks associated with smoking, including increased risk of clots and cardiovascular issues. - Encourage smoking cessation.  Screening for abdominal aortic aneurysm Screening for AAA recommended due to smoking history. Previous attempt to  schedule ultrasound was unsuccessful. - Reschedule abdominal aortic ultrasound screening.          Follow-up: Return in about 1 year (around 11/04/2024) for f/u CPE.   Ginger Patrick, FNP

## 2023-11-05 NOTE — Telephone Encounter (Signed)
 Would you mind reaching out to pt? He is overdue by one year for his 6 month CT f/u. He doesn't remember getting or call or he may have missed it.   He will be expecting a call to get set up! Thanks so much.

## 2023-11-07 ENCOUNTER — Other Ambulatory Visit: Payer: Self-pay | Admitting: Emergency Medicine

## 2023-11-07 DIAGNOSIS — Z87891 Personal history of nicotine dependence: Secondary | ICD-10-CM

## 2023-11-07 DIAGNOSIS — Z122 Encounter for screening for malignant neoplasm of respiratory organs: Secondary | ICD-10-CM

## 2023-11-07 NOTE — Progress Notes (Signed)
 Cholesterol has come down a slight bit but triglycerides are a bit elevated Was he fasting or had he had some creamer or food that am? I highly suggest that we start on a cholesterol medication to work on de plaquing the arteries and keep the cholesterol at lower levels to reduce overall risk for heart disease/stroke and or heart attack. Is pt willing?  He is mildly still prediabetic  but has come down one more number with his A1c.  Continue to focus on a prediabetic diet.

## 2023-11-07 NOTE — Telephone Encounter (Signed)
 Called and spoke with pt. Patient has been scheduled for 11/14/2023 at [facility]. Pt verbalized understanding and denied any further questions or concerns at this time.

## 2023-11-07 NOTE — Telephone Encounter (Signed)
 NOTED

## 2023-11-13 ENCOUNTER — Ambulatory Visit: Admission: RE | Admit: 2023-11-13 | Source: Ambulatory Visit

## 2023-11-13 ENCOUNTER — Ambulatory Visit
Admission: RE | Admit: 2023-11-13 | Discharge: 2023-11-13 | Disposition: A | Discharge status: Home / Self Care | Source: Ambulatory Visit | Attending: Family | Admitting: Family

## 2023-11-13 DIAGNOSIS — Z720 Tobacco use: Secondary | ICD-10-CM | POA: Insufficient documentation

## 2023-11-14 ENCOUNTER — Ambulatory Visit
Admission: RE | Admit: 2023-11-14 | Discharge: 2023-11-14 | Disposition: A | Discharge status: Home / Self Care | Source: Ambulatory Visit | Attending: Family | Admitting: Family

## 2023-11-14 ENCOUNTER — Ambulatory Visit
Admission: RE | Admit: 2023-11-14 | Discharge: 2023-11-14 | Disposition: A | Discharge status: Home / Self Care | Source: Ambulatory Visit | Attending: Acute Care | Admitting: Acute Care

## 2023-11-14 DIAGNOSIS — Z87891 Personal history of nicotine dependence: Secondary | ICD-10-CM | POA: Insufficient documentation

## 2023-11-14 DIAGNOSIS — R0989 Other specified symptoms and signs involving the circulatory and respiratory systems: Secondary | ICD-10-CM | POA: Diagnosis present

## 2023-11-14 DIAGNOSIS — Z122 Encounter for screening for malignant neoplasm of respiratory organs: Secondary | ICD-10-CM | POA: Diagnosis present

## 2023-11-14 DIAGNOSIS — I7143 Infrarenal abdominal aortic aneurysm, without rupture: Secondary | ICD-10-CM | POA: Insufficient documentation

## 2023-11-14 NOTE — Progress Notes (Signed)
 They did find a small abdominal aortic aneurysm on the ultrasound.  Thankfully the dimensions are pretty small. We will repeat this ultrasound every three years to monitor if there is any progression of size.   An aortic aneurysm is a localized dilation (bulging) of the aorta, the largest artery in the body that carries blood from the heart to the rest of the body  Often asymptomatic  However if any of these red flag symptoms occur this could be an emergency.   Dizziness or lightheadedness. Rapid heart rate. Sob. Pain or difficult swallowing, swelling. Sudden, severe chest pain, abdominal pain or back pain. Finding an aortic aneurysm before it ruptures offers your best chance of recovery. As an aortic aneurysm grows, you might notice symptoms including:  Ways to improve this from worsening:  Eating a heart-healthy diet. Getting regular exercise. Maintaining a healthy weight. Quitting smoking and using tobacco products

## 2023-11-18 NOTE — Progress Notes (Signed)
 Great news!  The good news is that stenosis is less than 50% so there is not significant plaquing which is good.  No further imaging is needed for this.

## 2023-11-21 ENCOUNTER — Ambulatory Visit

## 2023-11-21 ENCOUNTER — Other Ambulatory Visit: Payer: Self-pay

## 2023-11-21 DIAGNOSIS — Z87891 Personal history of nicotine dependence: Secondary | ICD-10-CM

## 2023-11-21 DIAGNOSIS — F17210 Nicotine dependence, cigarettes, uncomplicated: Secondary | ICD-10-CM

## 2023-11-21 DIAGNOSIS — Z7901 Long term (current) use of anticoagulants: Secondary | ICD-10-CM | POA: Diagnosis not present

## 2023-11-21 DIAGNOSIS — Z122 Encounter for screening for malignant neoplasm of respiratory organs: Secondary | ICD-10-CM

## 2023-11-21 LAB — POCT INR: INR: 2.7 (ref 2.0–3.0)

## 2023-11-21 MED ORDER — WARFARIN SODIUM 5 MG PO TABS
ORAL_TABLET | ORAL | 1 refills | Status: AC
Start: 2023-11-21 — End: ?

## 2023-11-21 NOTE — Patient Instructions (Addendum)
 Pre visit review using our clinic review tool, if applicable. No additional management support is needed unless otherwise documented below in the visit note.  Continue 1 tablet daily except take 1/2 tablet on Monday, Wednesday and Friday. Recheck in  6 weeks.

## 2023-11-21 NOTE — Progress Notes (Signed)
 Indication: APS, DVT Continue 1 tablet daily except take 1/2 tablet on Monday, Wednesday and Friday. Recheck in 6 weeks.  Pt is compliant with warfarin management and PCP apts.  Sent in refill of warfarin to requested pharmacy.

## 2024-01-02 ENCOUNTER — Ambulatory Visit

## 2024-01-02 DIAGNOSIS — Z7901 Long term (current) use of anticoagulants: Secondary | ICD-10-CM

## 2024-01-02 LAB — POCT INR: INR: 1.9 — AB (ref 2.0–3.0)

## 2024-01-02 NOTE — Progress Notes (Signed)
 Indication: APS, DVT Increase dose today to take 1 1/2 tablets and then continue 1 tablet daily except take 1/2 tablet on Monday, Wednesday and Friday. Recheck in 4 weeks.

## 2024-01-02 NOTE — Patient Instructions (Addendum)
 Pre visit review using our clinic review tool, if applicable. No additional management support is needed unless otherwise documented below in the visit note.  Increase dose today to take 1 1/2 tablets and then continue 1 tablet daily except take 1/2 tablet on Monday, Wednesday and Friday. Recheck in 4 weeks.

## 2024-01-30 ENCOUNTER — Ambulatory Visit

## 2024-01-30 DIAGNOSIS — Z7901 Long term (current) use of anticoagulants: Secondary | ICD-10-CM | POA: Diagnosis not present

## 2024-01-30 LAB — POCT INR: INR: 2.1 (ref 2.0–3.0)

## 2024-01-30 NOTE — Progress Notes (Signed)
 Continue 1 tablet daily except take 1/2 tablet on Monday, Wednesday and Friday . Recheck in 4 weeks.

## 2024-01-30 NOTE — Patient Instructions (Addendum)
 Pre visit review using our clinic review tool, if applicable. No additional management support is needed unless otherwise documented below in the visit note.  Continue 1 tablet daily except take 1/2 tablet on Monday, Wednesday and Friday . Recheck in 4 weeks.

## 2024-02-27 ENCOUNTER — Ambulatory Visit

## 2024-04-02 ENCOUNTER — Ambulatory Visit

## 2024-04-03 ENCOUNTER — Ambulatory Visit
